# Patient Record
Sex: Female | Born: 1965 | Hispanic: Yes | Smoking: Never smoker
Health system: Southern US, Community
[De-identification: ages and names within clinical notes are randomized; demographics above are authoritative.]

## PROBLEM LIST (undated history)

## (undated) DIAGNOSIS — E119 Type 2 diabetes mellitus without complications: Secondary | ICD-10-CM

## (undated) DIAGNOSIS — M1990 Unspecified osteoarthritis, unspecified site: Secondary | ICD-10-CM

## (undated) DIAGNOSIS — E079 Disorder of thyroid, unspecified: Secondary | ICD-10-CM

## (undated) DIAGNOSIS — K5900 Constipation, unspecified: Secondary | ICD-10-CM

## (undated) HISTORY — DX: Type 2 diabetes mellitus without complications: E11.9

## (undated) HISTORY — DX: Unspecified osteoarthritis, unspecified site: M19.90

## (undated) HISTORY — DX: Disorder of thyroid, unspecified: E07.9

---

## 2002-08-27 ENCOUNTER — Emergency Department (HOSPITAL_COMMUNITY): Admission: EM | Admit: 2002-08-27 | Discharge: 2002-08-27 | Payer: Self-pay | Admitting: Emergency Medicine

## 2005-08-12 ENCOUNTER — Emergency Department (HOSPITAL_COMMUNITY): Admission: EM | Admit: 2005-08-12 | Discharge: 2005-08-12 | Payer: Self-pay | Admitting: Emergency Medicine

## 2013-11-23 ENCOUNTER — Ambulatory Visit (INDEPENDENT_AMBULATORY_CARE_PROVIDER_SITE_OTHER): Payer: BC Managed Care – PPO | Admitting: Internal Medicine

## 2013-11-23 VITALS — BP 130/70 | HR 119 | Temp 101.7°F | Resp 16 | Ht 60.0 in | Wt 188.0 lb

## 2013-11-23 DIAGNOSIS — R197 Diarrhea, unspecified: Secondary | ICD-10-CM

## 2013-11-23 DIAGNOSIS — M791 Myalgia, unspecified site: Secondary | ICD-10-CM

## 2013-11-23 DIAGNOSIS — R509 Fever, unspecified: Secondary | ICD-10-CM

## 2013-11-23 DIAGNOSIS — R110 Nausea: Secondary | ICD-10-CM

## 2013-11-23 LAB — POCT CBC
Granulocyte percent: 81.7 %G — AB (ref 37–80)
HCT, POC: 39 % (ref 37.7–47.9)
Hemoglobin: 11.9 g/dL — AB (ref 12.2–16.2)
Lymph, poc: 1.2 (ref 0.6–3.4)
MCH, POC: 26.4 pg — AB (ref 27–31.2)
MCHC: 30.5 g/dL — AB (ref 31.8–35.4)
MCV: 86.5 fL (ref 80–97)
MID (cbc): 0.5 (ref 0–0.9)
MPV: 8.4 fL (ref 0–99.8)
POC Granulocyte: 7.6 — AB (ref 2–6.9)
POC LYMPH PERCENT: 13.2 %L (ref 10–50)
POC MID %: 5.1 %M (ref 0–12)
Platelet Count, POC: 325 10*3/uL (ref 142–424)
RBC: 4.51 M/uL (ref 4.04–5.48)
RDW, POC: 15.2 %
WBC: 9.3 10*3/uL (ref 4.6–10.2)

## 2013-11-23 LAB — POCT URINALYSIS DIPSTICK
Bilirubin, UA: NEGATIVE
Glucose, UA: NEGATIVE
Leukocytes, UA: NEGATIVE
Nitrite, UA: NEGATIVE
Protein, UA: 30
Spec Grav, UA: 1.02
Urobilinogen, UA: 0.2
pH, UA: 7

## 2013-11-23 LAB — POCT UA - MICROSCOPIC ONLY
Bacteria, U Microscopic: NEGATIVE
Casts, Ur, LPF, POC: NEGATIVE
Crystals, Ur, HPF, POC: NEGATIVE
Yeast, UA: NEGATIVE

## 2013-11-23 MED ORDER — ONDANSETRON 4 MG PO TBDP
4.0000 mg | ORAL_TABLET | Freq: Once | ORAL | Status: AC
  Administered 2013-11-23: 4 mg via ORAL

## 2013-11-23 MED ORDER — DICYCLOMINE HCL 20 MG PO TABS: 20.0000 mg | ORAL_TABLET | Freq: Four times a day (QID) | ORAL | Status: DC

## 2013-11-23 NOTE — Patient Instructions (Signed)
2 Tylenol every 6 hours for 2 days will control fever and pain

## 2013-11-23 NOTE — Progress Notes (Signed)
Subjective:    Patient ID: Courtney Foster, female    DOB: 03/07/1966, 48 y.o.   MRN: [id number]  Chief Complaint  Patient presents with  . Diarrhea    every hour since last night  . Generalized Body Aches  . Fever    Diarrhea  Associated symptoms include abdominal pain and a fever. Pertinent negatives include no coughing or vomiting.  Fever  Associated symptoms include abdominal pain, diarrhea and nausea. Pertinent negatives include no coughing or vomiting.   This chart was scribed for Courtney Lin, MD by Thea Alken, ED Scribe. This patient was seen in room 12 and the patient's care was started at 1:22 PM.  HPI Comments: Courtney Foster is a 48 y.o. female who presents to the [facility] complaining of diarrhea onset 1 day . Pt report that she has been sick for about 2 weeks with a cold but denies having diarrhea 2 weeks ago . Today the pt is complaining of medial abdominal pain, diarrhea with 4 episodes 1 day ago, nausea, body aches and fatigue. Pt denies having an episode of emesis.  She denies cough, dysuria and frequency. Pt denies sick contact She had a similar illness one week ago.  Past Medical History  Diagnosis Date  . Thyroid disease    No Known Allergies Prior to Admission medications   Medication Sig Start Date End Date Taking? Authorizing Provider  levothyroxine (SYNTHROID, LEVOTHROID) 75 MCG tablet Take 75 mcg by mouth daily before breakfast.   Yes Historical Provider, MD   Review of Systems  Constitutional: Positive for fever and fatigue.  Respiratory: Negative for cough.   Gastrointestinal: Positive for nausea, abdominal pain and diarrhea. Negative for vomiting.  Genitourinary: Negative for dysuria and frequency.      Objective:   Physical Exam  Nursing note and vitals reviewed. Constitutional: She is oriented to person, place, and time. She appears well-developed and well-nourished. No distress.  HENT:  Head: Normocephalic and  atraumatic.  Eyes: EOM are normal.  Neck: Neck supple. No tracheal deviation present.  Cardiovascular: Normal rate.   Pulmonary/Chest: Effort normal. No respiratory distress.  Musculoskeletal: Normal range of motion.  Neurological: She is alert and oriented to person, place, and time.  Skin: Skin is warm and dry.  Psychiatric: She has a normal mood and affect. Her behavior is normal.   Filed Vitals:   11/23/13 1224  BP: 130/70  Pulse: 119  Temp: 101.7 F (38.7 C)  TempSrc: Oral  Resp: 16  Height: 5' (1.524 m)  Weight: 188 lb (85.276 kg)  SpO2: 99%   Results for orders placed in visit on 11/23/13  POCT CBC      Result Value Ref Range   WBC 9.3  4.6 - 10.2 K/uL   Lymph, poc 1.2  0.6 - 3.4   POC LYMPH PERCENT 13.2  10 - 50 %L   MID (cbc) 0.5  0 - 0.9   POC MID % 5.1  0 - 12 %M   POC Granulocyte 7.6 (*) 2 - 6.9   Granulocyte percent 81.7 (*) 37 - 80 %G   RBC 4.51  4.04 - 5.48 M/uL   Hemoglobin 11.9 (*) 12.2 - 16.2 g/dL   HCT, POC 39.0  37.7 - 47.9 %   MCV 86.5  80 - 97 fL   MCH, POC 26.4 (*) 27 - 31.2 pg   MCHC 30.5 (*) 31.8 - 35.4 g/dL   RDW, POC 15.2     Platelet  Count, POC 325  142 - 424 K/uL   MPV 8.4  0 - 99.8 fL  POCT URINALYSIS DIPSTICK      Result Value Ref Range   Color, UA yellow     Clarity, UA clear     Glucose, UA neg     Bilirubin, UA neg     Ketones, UA eng     Spec Grav, UA 1.020     Blood, UA trace     pH, UA 7.0     Protein, UA 30     Urobilinogen, UA 0.2     Nitrite, UA neg     Leukocytes, UA Negative    POCT UA - MICROSCOPIC ONLY      Result Value Ref Range   WBC, Ur, HPF, POC 0-4     RBC, urine, microscopic 0-2     Bacteria, U Microscopic neg     Mucus, UA large     Epithelial cells, urine per micros 2-6     Crystals, Ur, HPF, POC neg     Casts, Ur, LPF, POC neg     Yeast, UA neg         Assessment & Plan:  I have completed the patient encounter in its entirety as documented by the scribe, with editing by me where  necessary. Robert P. Laney Pastor, M.D. Diarrhea - Fever - Nausea - Myalgia -due to fever  Meds ordered this encounter  Medications  . ondansetron (ZOFRAN-ODT) disintegrating tablet 4 mg    Sig:   . dicyclomine (BENTYL) 20 MG tablet    Sig: Take 1 tablet (20 mg total) by mouth every 6 (six) hours.    Dispense:  12 tablet    Refill:  0   OOW 2 d Stool cult

## 2013-11-25 ENCOUNTER — Telehealth: Payer: Self-pay

## 2013-11-25 DIAGNOSIS — R197 Diarrhea, unspecified: Secondary | ICD-10-CM

## 2013-11-25 NOTE — Telephone Encounter (Signed)
Dr. Laney Pastor, pt already had note that you signed earlier today, but isn't feeling much better and now needs one for today and tomorrow, return on Thurs. Is this ok?

## 2013-11-26 MED ORDER — CIPROFLOXACIN HCL 500 MG PO TABS: 500.0000 mg | ORAL_TABLET | Freq: Two times a day (BID) | ORAL | Status: DC

## 2013-11-26 NOTE — Telephone Encounter (Signed)
Spoke with pt. She states she cannot return to work tomorrow because she is still having diarrhea and her body aches. She says she has to work Thursday and Saturday and would like to be out of work until Monday. Do you want her to RTC since she is still not feeling well?

## 2013-11-26 NOTE — Telephone Encounter (Signed)
Sent med to [employer]. LMOM for pt to CB.

## 2013-11-26 NOTE — Telephone Encounter (Signed)
Cult still pending We should start empiric treatment and ok out of work Call in cipro 500 bid for 7 days If not well recheck mondat at 4

## 2013-11-26 NOTE — Telephone Encounter (Signed)
Pt Cb and I explained new Abx and f/up on Monday if not better. Wrote OOW note for pt and she will p/up.

## 2013-11-26 NOTE — Telephone Encounter (Signed)
[place] for [employer] note

## 2013-11-28 ENCOUNTER — Telehealth: Payer: Self-pay | Admitting: Physician Assistant

## 2013-11-28 NOTE — Telephone Encounter (Signed)
Error

## 2013-12-01 ENCOUNTER — Ambulatory Visit (INDEPENDENT_AMBULATORY_CARE_PROVIDER_SITE_OTHER): Payer: BC Managed Care – PPO | Admitting: Internal Medicine

## 2013-12-01 VITALS — BP 110/70 | HR 68 | Temp 98.4°F | Resp 18 | Ht 60.5 in | Wt 190.0 lb

## 2013-12-01 DIAGNOSIS — A049 Bacterial intestinal infection, unspecified: Secondary | ICD-10-CM

## 2013-12-01 MED ORDER — DICYCLOMINE HCL 20 MG PO TABS: 20.0000 mg | ORAL_TABLET | Freq: Four times a day (QID) | ORAL | Status: DC

## 2013-12-01 NOTE — Progress Notes (Signed)
Subjective:     Patient ID: Courtney Foster, female   DOB: Sep 21, 1965, 48 y.o.   MRN: [id number]  HPI 48 yr old female complains of continued diarrhea which started on 11/22/13.   Was seen in office on 11/23/13 complaining of fever and body aches along with diarrhea. Started cipro on 11/26/13. Pt iIs no longer having fever and body aches but continues to have diarrhea and fatigue.  Loose stools still present if she eats soft foods.  Around 3 episodes of loose stools per day.  Denies blood and very foul smelling stools but states there is still mucus. Complaining of left upper quadrant pain.  Denies nausea and vomiting.  Able to eat and is stating she is hungry.  Overall pt seems to be improving but is curious about her stool culture results.  Stool culture still pending.   Review of Systems  Constitutional: Positive for fatigue. Negative for fever, activity change and appetite change.  HENT: Negative for congestion.   Respiratory: Negative for shortness of breath.   Cardiovascular: Positive for chest pain. Negative for leg swelling.  Gastrointestinal: Positive for nausea, abdominal pain, diarrhea and abdominal distention. Negative for vomiting, constipation and blood in stool.  Genitourinary: Negative for frequency and difficulty urinating.  Musculoskeletal: Negative for arthralgias.  Skin: Negative for rash.  Neurological: Negative for dizziness, light-headedness and headaches.       Objective:   Physical Exam GEN: WDWN, NAD, Non-toxic, A & O x 3 HEENT: Atraumatic, Normocephalic. Neck supple. No masses, No LAD. Left eye w visible cataract  Ears and Nose: No external deformity. CV: RRR, No M/G/R. No JVD. No thrill. No extra heart sounds. PULM: CTA B, no wheezes, crackles, rhonchi. No retractions. No resp. distress. No accessory muscle use. ABD:  + BS, obese, soft, no rebound, no hsm, non tender EXTR: No c/c/e NEURO Normal gait.  CN II-XII intact  PSYCH: Normally interactive. Conversant. Not  depressed or anxious appearing.  Calm demeanor.       Assessment:    1. Likely bacterial gastrointestinal infection    Plan:      1. Finish course of ciprofloxacin 2. awaiting stool culture results 3. Ok to go back to work  4. Refill Bentyl   I have completed the patient encounter in its entirety as documented by [place], with editing by me where necessary. Nikitha Mode P. Laney Pastor, M.D.

## 2013-12-16 ENCOUNTER — Encounter: Payer: Self-pay | Admitting: Internal Medicine

## 2013-12-16 ENCOUNTER — Telehealth: Payer: Self-pay | Admitting: Internal Medicine

## 2013-12-16 NOTE — Telephone Encounter (Signed)
Patient contact number is [phone]

## 2013-12-16 NOTE — Telephone Encounter (Signed)
All done --Threasa Beards finished and gave to med records--Jasmine

## 2013-12-16 NOTE — Telephone Encounter (Signed)
Patient dropped off disability paperwork on 12/12/2013. Stated that she needed this completed before 12/17/2013. Explained to patient that the time frame to complete such paperwork is 5-7 business days and that her forms are in Dr. Ninfa Meeker box awaiting completion. Patient called again and stated that she spoke with her job and they have requested that she a treatment letter faxed to them stating what she was treated for etc. Patient says that this is very urgent has to be done today. Please fax signed letter [phone]  Please call patient for more information and to confirm fax number (she read it to me 3x but it was difficult to understand.)

## 2014-01-07 LAB — STOOL CULTURE

## 2014-03-25 DIAGNOSIS — Z0271 Encounter for disability determination: Secondary | ICD-10-CM

## 2014-05-08 ENCOUNTER — Ambulatory Visit (INDEPENDENT_AMBULATORY_CARE_PROVIDER_SITE_OTHER): Payer: BC Managed Care – PPO | Admitting: Internal Medicine

## 2014-05-08 VITALS — BP 132/72 | HR 70 | Temp 97.5°F | Resp 16 | Ht 61.0 in | Wt 196.0 lb

## 2014-05-08 DIAGNOSIS — N925 Other specified irregular menstruation: Secondary | ICD-10-CM

## 2014-05-08 DIAGNOSIS — N938 Other specified abnormal uterine and vaginal bleeding: Secondary | ICD-10-CM

## 2014-05-08 DIAGNOSIS — N949 Unspecified condition associated with female genital organs and menstrual cycle: Secondary | ICD-10-CM

## 2014-05-08 DIAGNOSIS — N926 Irregular menstruation, unspecified: Secondary | ICD-10-CM

## 2014-05-08 LAB — POCT URINE PREGNANCY: Preg Test, Ur: NEGATIVE

## 2014-05-09 NOTE — Progress Notes (Signed)
She is here out of concern that her last period was in May 2015 She has had no weight gain, nausea, breast tenderness but considers pregnancy possible. Her 2 children are 9 and 11. Of note she has hypothyroidism which has not been well controlled recently with 2 dose escalations in the last 6 months.  Review of GI and GU negative  Exam BP 132/72  Pulse 70  Temp(Src) 97.5 F (36.4 C) (Oral)  Resp 16  Ht 5\' 1"  (1.549 m)  Wt 196 lb (88.905 kg)  BMI 37.05 kg/m2  SpO2 100%  LMP 11/19/2013 No distress Alert and oriented  Results for orders placed in visit on 05/08/14  POCT URINE PREGNANCY      Result Value Ref Range   Preg Test, Ur Negative     Impression Secondary amenorrhea due to hypothyroidism rather than pregnancy  She will follow with her primary care next week as planned

## 2014-08-31 ENCOUNTER — Ambulatory Visit (INDEPENDENT_AMBULATORY_CARE_PROVIDER_SITE_OTHER): Payer: BLUE CROSS/BLUE SHIELD | Admitting: Gynecology

## 2014-08-31 ENCOUNTER — Encounter: Payer: Self-pay | Admitting: Gynecology

## 2014-08-31 VITALS — BP 130/86 | Ht 60.0 in | Wt 200.0 lb

## 2014-08-31 DIAGNOSIS — N911 Secondary amenorrhea: Secondary | ICD-10-CM

## 2014-08-31 DIAGNOSIS — E669 Obesity, unspecified: Secondary | ICD-10-CM

## 2014-08-31 DIAGNOSIS — E039 Hypothyroidism, unspecified: Secondary | ICD-10-CM

## 2014-08-31 DIAGNOSIS — N951 Menopausal and female climacteric states: Secondary | ICD-10-CM

## 2014-08-31 LAB — PREGNANCY, URINE: Preg Test, Ur: NEGATIVE

## 2014-08-31 NOTE — Progress Notes (Signed)
   Patient is a 49 year old gravida 4 para 2 Ab2 (2 cesarean section/2 spontaneous AB) who was referred to our practice as a courtesy of her endocrinologist Dr. Diamantina Providence in reference to patient's secondary amenorrhea. Patient was able to bring some notes from her last office with him dated 07/31/2014 whereby he had adjusted her thyroid medication. According to his notes patient has had history of Graves' disease/R immune thyroid disease who had been off of her thyroid medication for proximally one month and develop subsequently hypothyroidism for which she believed was a variant of Hashimoto's thyroiditis. He had checked her TSH which we do not have the result but patient stated that she is currently on levothyroid 100 g daily and on Saturday she takes a tablet and a half. He was in the process of ordering an [facility] which we do not have the result. Patient has not been using any form of contraception. Patient denies any unusual headaches or visual disturbances or any galactorrhea. Her urine pregnancy test in our office today was negative. She states that her PCP in [facility] has been doing her exams and Pap smear and she is not due until the end of this year.  Exam: Overweight female with a weight of 200 pounds BMI 39.06 blood pressure 130/86 Abdomen: Pendulous soft nontender no rebound or guarding Pelvic: Bartholin urethra Skene glands within normal limits Vagina: No lesions or discharge Uterus: Somewhat limited as a result of her abdominal girth nontender Adnexa same as above Rectal exam: Not done  Assessment for/plan: Secondary amenorrhea on this 49 year old with no vasomotor symptoms or any breast tenderness, nausea, vomiting, visual disturbances or any unusual headache or nipple discharge. We are going to check a prolactin level today, along with her [facility] and attempted to get the results of her recent thyroid function tests make sure that she is euthyroid. Patient could  potentially be perimenopausal. Literature information on perimenopause/menopause/hormone replacement therapy was provided in Spanish. She will return back to the office for an ultrasound for better assessment of her female reproductive organs and discussed the above results.

## 2014-08-31 NOTE — Patient Instructions (Addendum)
Perimenopausia (Perimenopause) La perimenopausia es el momento en que su cuerpo comienza a pasar a la menopausia (sin menstruacin durante 12 meses consecutivos). Es un proceso natural. La perimenopausia puede comenzar entre 2 y 33 aos antes de la menopausia y por lo general tiene una duracin de 1 ao ms pasada la menopausia. [employer], los ovarios podran producir un vulo o no. Los ovarios varan su produccin de las hormonas estrgeno y Technical brewer. Esto puede causar perodos menstruales irregulares, dificultad para quedar embarazada, hemorragia vaginal entre perodos y sntomas incmodos. CAUSAS  Produccin irregular de las hormonas ovricas estrgeno y Immunologist, y no ovular todos los meses.  Otras causas son:  Tumor de la glndula pituitaria.  Enfermedades que [employer] ovarios.  Radioterapia.  Quimioterapia.  Causas desconocidas.  Fumar mucho y abusar del consumo de alcohol puede llevar a que la perimenopausia aparezca antes. SIGNOS Y SNTOMAS   Acaloramiento.  Sudoracin nocturna.  Perodos menstruales irregulares.  Disminucin del deseo sexual.  Sequedad vaginal.  Dolores de cabeza.  Cambios en el estado de nimo.  Depresin.  Problemas de memoria.  Irritabilidad.  Cansancio.  Aumento de [place].  Problemas para quedar embarazada.  Prdida de clulas seas (osteoporosis).  Comienzo de endurecimiento de las arterias (aterosclerosis). DIAGNSTICO  El mdico realizar un diagnstico en funcin de su edad, historial de perodos menstruales y sntomas. Le realizarn un examen fsico para ver si hay algn cambio en su cuerpo, en especial en sus rganos reproductores. Las pruebas hormonales pueden ser o no tiles segn la cantidad de hormonas femeninas que produzca y [employer] produzca. Sin embargo, podrn Microbiologist pruebas hormonales para Statistician. TRATAMIENTO  En algunos casos, no se necesita tratamiento. La  decisin acerca de qu tratamiento es necesario durante la perimenopausia deber realizarse en conjunto con su mdico segn cmo estn afectando los sntomas a su estilo de vida. Existen varios tratamientos disponibles, como:  Risk manager cada sntoma individual con medicamentos especficos para ese sntoma.  Algunos medicamentos herbales pueden ayudar en sntomas especficos.  Psicoterapia.  Terapia grupal. INSTRUCCIONES PARA EL CUIDADO EN EL HOGAR   Controle sus periodos menstruales (cundo ocurren, qu tan abundantes son, cunto tiempo pasa entre perodos, y cunto duran) como tambin sus sntomas y cundo comenzaron.  Tome slo medicamentos de venta libre o recetados, segn las indicaciones del mdico.  Duerma y descanse.  Haga actividad fsica.  Consuma una dieta que contenga calcio (bueno para los [place]) y productos derivados de la soja (actan como estrgenos).  No fume.  Evite las bebidas alcohlicas.  Tome los suplementos vitamnicos segn las indicaciones del mdico. En ciertos casos, puede ser de [place] tomar vitamina E.  Tome suplementos de calcio y vitamina D para ayudar a Publishing rights manager prdida sea.  En algunos casos la terapia de grupo podr ayudarla.  La acupuntura puede ser de ayuda en ciertos casos. SOLICITE ATENCIN MDICA SI:   Tiene preguntas acerca de sus sntomas.  Necesita ser derivada a un especialista (gineclogo, psiquiatra, o psiclogo). SOLICITE ATENCIN MDICA DE INMEDIATO SI:   Sufre una hemorragia vaginal abundante.  Su perodo menstrual dura ms de 8 das.  Sus perodos son recurrentes cada menos de [address]  Tiene hemorragias durante las [employer].  Est muy deprimido.  Siente dolor al [employer].  Siente dolor de cabeza intenso.  Tiene problemas de visin. Document Released: 08/07/2005 Document Revised: 05/28/2013 [facility] Patient Information 2015 [place], [place]. This information is not intended to replace advice given to you  by your health care provider. Make sure you discuss any questions you have with your health care provider. Hipotiroidismo (Hypothyroidism) La tiroides es una glndula grande ubicada en la parte anterior e inferior del cuello. La glndula tiroides interviene en el control del metabolismo. El metabolismo es el modo en que el organismo utiliza los alimentos. El control del metabolismo se realiza a travs de una hormona denominada tiroxina. Cuando la actividad de esta glndula est por debajo de lo normal (hipotiroidismo) produce muy poca cantidad de hormona. CAUSAS Aqu se incluyen:  Ausencia de tejido tiroideo. Bocio por dficit de yodo. Bocio por medicamentos. Defectos congnitos (desde el nacimiento). Trastornos de la glndula pituitaria Esto ocasiona la falta de TSH (sigla que significa hormona estimulante de la tiroides) Esta hormona le informa a la tiroides que debe producir ms hormona. SNTOMAS Letargia (sentir que no se tiene Teacher, early years/pre) Intolerancia al fro [employer] (a pesar de una ingesta normal de alimentos) Piel seca [place] Irregularidades menstruales Enlentecimiento de los procesos de pensamiento La insuficiente cantidad de hormona tiroidea tambin puede ocasionar problemas cardacos. El hipotiroidismo en el recin nacido es el cretinismo en su forma extrema. Es importante que esta forma se trate de modo adecuado e inmediato, ya que puede conducir rpidamente al retardo del desarrollo fsico y mental. DIAGNSTICO Para comprobar la existencia de hipotiroidismo, Mining engineer anlisis de sangre y radiografas y estudios con [employer]. Muchas veces los signos estn ocultos. Es necesario que el profesional vigile la enfermedad con anlisis de [place]. Esto se realiza luego de Electrical engineer diagnstico (determinar cul es el problema). Puede ser necesario que el profesional que lo asiste controle esta enfermedad con anlisis de sangre ya sea antes o despus del  diagnstico y [place]. TRATAMIENTO Los niveles bajos de hormona tiroidea se incrementan con el uso de hormona tiroidea sinttica. Este es un tratamiento seguro y [place]. Se dispone de hormona tiroidea sinttica para el tratamiento de este trastorno. Generalmente lleva algunas semanas obtener el efecto total de los medicamentos. Luego de obtener el efecto completo del [place], habitualmente pasan otras cuatro semanas para que los sntomas empiezan a Armed forces operational officer. El profesional podr comenzar indicndole dosis bajas. Si usted tuvo problemas cardacos, la dosis se aumentar de manera gradual. Podr volver a lo normal sin Firefighter una situacin de emergencia. [place] los [employer] ha indicado el profesional que lo asiste. Infrmele al profesional todos los medicamentos que toma o que ha comenzado a Radio producer. El profesional que lo asiste lo ayudar con los esquemas de las dosis. A medida que obtiene mejora, es necesario aumentar la dosis. Ser necesario Optometrist continuos anlisis de [place], segn lo indique el profesional. Informe acerca de todos los efectos secundarios que sospeche que podran deberse a los medicamentos. SOLICITE ATENCIN MDICA SI: Solicite atencin mdica si observa: Sudoracin. Temblores. Ansiedad. Rpida prdida de peso. Intolerancia al calor. Cambios emocionales. Diarrea. Debilidad. SOLICITE ATENCIN MDICA DE INMEDIATO SI: Presenta dolor en el pecho, una frecuencia cardaca irregular (palpitaciones) o latidos cardacos rpidos. EST SEGURO QUE:  Comprende las instrucciones para el alta mdica. Controlar su enfermedad. Solicitar atencin mdica de inmediato segn las indicaciones. Document Released: 08/07/2005 Document Revised: 10/30/2011 [facility] Patient Information 2015 [place]. This information is not intended to replace advice given to you by your health care provider. Make sure you discuss any  questions you have with your health care provider. Terapia de reemplazo hormonal (Hormone Replacement Therapy) En la menopausia, su cuerpo comienza a  producir menos estrgeno y Immunologist. Esto provoca que el cuerpo deje de tener perodos [place]. Esto se debe a que el estrgeno y la progesterona controlan sus perodos y su ciclo menstrual. Ardelia Mems falta de estrgeno puede causar sntomas tales como: Social research officer, government. Sequedad vaginal Piel seca. Prdida del deseo sexual. Riesgo de prdida de hueso (osteoporosis). Cuando esto ocurre, puede elegir realizar Ardelia Mems terapia hormonal para volver a Clinical research associate estrgeno perdido [employer]. Cuando slo se introduce esta hormona, el procedimiento se conoce normalmente como TRE (terapia de reemplazo de [place]). Cuando la hormona progestina se combina con el estrgeno, el procedimiento se conoce normalmente como TH (terapia hormonal). Esto es lo que previamente se conoca como terapia de reemplazo hormonal (TRH). El profesional que le asiste le ayudar a tomar una decisin acerca de lo que resulte lo mejor para usted. La decisin de realizar una TRH cambia a menudo debido a que se Risk manager. Muchos estudios no ponen de acuerdo con respecto a los beneficios de Optometrist una terapia de reemplazo hormonal.  BENEFICIOS PROBABLES DE LA TRH QUE INCLUYEN PROTECCIN CONTRA: Golpes de calor - Un golpe de calor es la sensacin repentina de calor sobre la cara y el cuerpo. La piel enrojece, como al sonrojarse. Estn asociados con la transpiracin y los trastornos del sueo. Las mujeres que atraviesan la menopausia pueden tener golpes de calor unas pocas veces en el mes o varios al da; esto depende de la mujer. Osteoporosis (prdida de hueso) - El estrgeno ayuda a protegerse contra la prdida de [place]. Luego de la [place], los huesos de una mujer pierden calcio y se vuelven frgiles y Publishing rights manager. Como resultado, es ms probable que el hueso se  [place]. Los que resultan afectados con mayor frecuencia son los de la cadera, la [place] y la columna vertebral. La terapia hormonal puede ayudar a retardar la prdida de hueso luego de la menopausia. Realizar ejercicios con peso y tomar calcio con vitamina D tambin puede ayudar a prevenir la prdida de [place]. Existen medicamentos que puede prescribir el profesional que la asiste para ayudar a prevenir la osteoporosis. Sequedad vaginal - La prdida de estrgeno produce cambios en la vagina. El recubrimiento de la misma puede volverse fino y Education officer, museum. Estos cambios pueden causar dolor y [place]. La sequedad tambin puede producir una infeccin. Puede ocasionarle ardor y [place]. Las infecciones en las vas urinarias son ms comunes luego de la menopausia debido a la falta de Oceanographer. Otros beneficios posibles del estrgeno incluyen un cambio positivo en el humor y en la memoria de corto plazo en las mujeres. EFECTOS SECUNDARIOS Y RIESGOS Utilizar estrgeno slo sin progesterona causa que el recubrimiento del tero crezca. Esto aumenta el riesgo de cncer endometrial. El profesional que la asiste deber darle otra hormona llamada progestina, si usted tiene tero. Las mujeres que realizan una TH combinada (estrgeno y progestina) parecen tener un mayor riesgo de sufrir cncer de mama. El riesgo parece ser [place], [place] aumenta a lo largo del tiempo que se realice la [place]. La terapia combinada tambin hace que el tejido mamario sea levemente ms denso, lo que hace que sea ms difcil leer mamografas (radiografas de mama). Combinada, la terapia de estrgeno y Immunologist puede realizarse todos los das, en cuyo caso podrn Warehouse manager de [place]. La TH puede realizarse de [place] cclica, en cuyo caso tendr perodos menstruales. La TH puede aumentar el riesgo de [place], ataque cardaco, cncer de mama y formacin de cogulos en la  pierna. TRATAMIENTO Si decide realizar  Ardelia Mems TH y tiene tero, normalmente se prescribe el uso de estrgeno y progestina. El profesional que la asiste le ayudar a decidir la mejor forma de Mattel. Lo mejor es Chief Executive Officer dosis posible que pueda ayudarla con sus sntomas y tomarlos durante la menor cantidad de tiempo posible. La terapia hormonal puede ayudar a Banker de los problemas (sntomas) que afectan a las mujeres durante la menopausia. Antes de tomar una decisin con respecto a la TH, converse con el profesional que la asiste acerca de qu es lo mejor para usted. Mantngase bien informada y sintase cmoda con sus decisiones. INSTRUCCIONES PARA EL CUIDADO DOMICILIARIO: [place] indicaciones del profesional con respecto a cmo Biomedical scientist. Hgase controles de [place] regular, e incluya [place] y [place]. SOLICITE ATENCIN MDICA DE INMEDIATO SI PRESENTA: Hemorragia vaginal anormal. Dolor o inflamacin en las piernas, falta de aliento o Tourist information centre manager. Mareos o dolores de [place]. Protuberancias o cambios en sus mamas o axilas. Pronunciacin inarticulada. Debilidad o adormecimiento en los brazos o las piernas. Dolor, ardor o sangrado al [employer]. Dolor abdominal. Document Released: 01/24/2008 Document Revised: 10/30/2011 [facility] Patient Information 2015 [place], [place]. This information is not intended to replace advice given to you by your health care provider. Make sure you discuss any questions you have with your health care provider.

## 2014-09-01 LAB — FOLLICLE STIMULATING HORMONE: FSH: 3.3 m[IU]/mL

## 2014-09-01 LAB — PROLACTIN: Prolactin: 24 ng/mL

## 2014-09-02 ENCOUNTER — Ambulatory Visit (INDEPENDENT_AMBULATORY_CARE_PROVIDER_SITE_OTHER): Payer: BLUE CROSS/BLUE SHIELD | Admitting: Gynecology

## 2014-09-02 ENCOUNTER — Other Ambulatory Visit: Payer: Self-pay | Admitting: Gynecology

## 2014-09-02 ENCOUNTER — Encounter: Payer: Self-pay | Admitting: Gynecology

## 2014-09-02 ENCOUNTER — Telehealth: Payer: Self-pay | Admitting: *Deleted

## 2014-09-02 ENCOUNTER — Other Ambulatory Visit: Payer: Self-pay | Admitting: Internal Medicine

## 2014-09-02 ENCOUNTER — Ambulatory Visit (INDEPENDENT_AMBULATORY_CARE_PROVIDER_SITE_OTHER): Payer: BLUE CROSS/BLUE SHIELD

## 2014-09-02 VITALS — BP 132/84

## 2014-09-02 DIAGNOSIS — R1900 Intra-abdominal and pelvic swelling, mass and lump, unspecified site: Secondary | ICD-10-CM

## 2014-09-02 DIAGNOSIS — N951 Menopausal and female climacteric states: Secondary | ICD-10-CM

## 2014-09-02 DIAGNOSIS — N9489 Other specified conditions associated with female genital organs and menstrual cycle: Secondary | ICD-10-CM | POA: Insufficient documentation

## 2014-09-02 DIAGNOSIS — R9389 Abnormal findings on diagnostic imaging of other specified body structures: Secondary | ICD-10-CM

## 2014-09-02 DIAGNOSIS — N838 Other noninflammatory disorders of ovary, fallopian tube and broad ligament: Secondary | ICD-10-CM

## 2014-09-02 DIAGNOSIS — E669 Obesity, unspecified: Secondary | ICD-10-CM

## 2014-09-02 DIAGNOSIS — N911 Secondary amenorrhea: Secondary | ICD-10-CM

## 2014-09-02 DIAGNOSIS — N949 Unspecified condition associated with female genital organs and menstrual cycle: Secondary | ICD-10-CM

## 2014-09-02 DIAGNOSIS — N912 Amenorrhea, unspecified: Secondary | ICD-10-CM

## 2014-09-02 DIAGNOSIS — R938 Abnormal findings on diagnostic imaging of other specified body structures: Secondary | ICD-10-CM

## 2014-09-02 DIAGNOSIS — N839 Noninflammatory disorder of ovary, fallopian tube and broad ligament, unspecified: Secondary | ICD-10-CM

## 2014-09-02 MED ORDER — MEDROXYPROGESTERONE ACETATE 10 MG PO TABS: 10.0000 mg | ORAL_TABLET | Freq: Every day | ORAL | Status: DC

## 2014-09-02 NOTE — Progress Notes (Signed)
   Patient presented to the office today to discuss the ultrasound done here in our office. This is being done as part of her evaluation for secondary menorrhea/obesity/perimenopause with history of hypothyroidism. Please see previous note dated January 11 for additional detail. Her [place] and prolactin were normal yesterday here in the office and her pregnancy test was negative. 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 This resulted in 2 successful pregnancies resulting in cesarean sections. Patient denies any nipple discharge or any unusual headaches or blurry vision.  Ultrasound today: Uterus measured 10.9 x 7.6 x 5.7 cm with endometrial stripe of 11.7 mm. (Last reported menstrual period 11/29/2013). Right ovary had 2 simple cysts echo-free measuring 19 x 18 mm, 16 x 20 mm. 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 There was no fluid in the cul-de-sac.  Patient has stated she has had left lower quadrant discomfort for quite some time. She did have an upper abdominal ultrasound ordered by her PCP last year and was diagnosis of fatty liver.  Assessment/plan: Secondary amenorrhea probably attributed to her obesity contributing to anovulation. Risk of endometrial hyperplasia and endometrial cancer was discussed with the patient. Since her urine patency test was negative she will be prescribed Provera 10 mg to take 1 by mouth daily for 10 days to withdrawal. I've given a refill for her to take every 30 days if she does not have a spontaneous menses after having performed a home urine pregnancy test confirming that it is negative. She is  currently using condoms for contraception. We are going to order a CA 125 today and schedule an MRI of the pelvis in the next few weeks to help delineate the mass from a hydrosalpinx versus an ovarian mass. We'll then set down and discuss the results and plan a course of management which will probably be a laparoscopic bilateral salpingectomy or left salpingo-oophorectomy with right salpingectomy. Literature information on all the above was provided in [place].

## 2014-09-02 NOTE — Telephone Encounter (Signed)
Scheduled on 09/16/14 @ 11:45am check in, at [facility] will need to be NPO (nothing by mouth 4 hours prior to test) [phone]. Pt will need to go to radiology department. Rosemarie Ax will inform patient.

## 2014-09-02 NOTE — Patient Instructions (Signed)
Health visitor Doctor, general practice Imaging) La Health visitor (RM) es un estudio de diagnstico por imgenes que produce imgenes digitales ntidas del interior del organismo sin usar rayosX. El resonador magntico [place] de radio y Engineer, manufacturing magntico para crear las imgenes. Las imgenes de Health visitor pueden Photographer en comparacin con las imgenes que se obtienen a Proofreader de las radiografas, las tomografas computarizadas o las ecografas. Se puede inyectar una sustancia de contraste para que las imgenes de la resonancia magntica sean incluso ms ntidas. En un resonador P[employer] Inc, la zona del cuerpo en estudio estar dentro de la abertura central del aparato. En los resonadores abiertos, el aparato no rodea el cuerpo por completo.  INFORME A SU MDICO:  Si tiene cirugas previas.  Cualquier pieza de metal que tenga en el organismo. El imn utilizado en la resonancia magntica puede mover los objetos metlicos que hay en el cuerpo. Esto incluye:  Un marcapasos o cualquier otro implante de metal, como un neuroestimulador implantado, un implante metlico del odo o un objeto metlico dentro de la rbita del ojo.  Esquirlas metlicas en el cuerpo.  Cualquier fragmento de bala.  Un puerto para la administracin de insulina o quimioterapia.  Cualquier tatuaje. Algunas sustancias de contraste de color rojo contienen hierro, lo que a veces es un problema.  Si est embarazada o piensa que podra estarlo.  Si est amamantando.  Si le teme a los espacios cerrados (tiene claustrofobia). Si la claustrofobia es un problema, a menudo puede aliviarse con medicamentos o con el uso de un resonador magntico abierto.  Cualquier alergia que tenga.  Todos los L[employer]ell [employer], incluidos vitaminas, hierbas, gotas oftlmicas, cremas y medicamentos de venta libre. RIESGOS Y COMPLICACIONES  En general, la resonancia magntica es un  procedimiento seguro. Sin embargo, pueden ocurrir algunos problemas que incluyen:  Si hay un implante de metal y no es Personal assistant, el potente campo magntico puede afectarlo. Adems, si el implante est cerca del lugar que se estudiar, puede ser difcil obtener imgenes de alta calidad.  Si est embarazada:  Generalmente, no debe realizarse Clinical biochemist trimestre de Media planner. No se conocen los efectos que la resonancia magntica puede tener en el feto. Durante este tiempo, es Multimedia programmer, a menos que se sospeche la presencia de una enfermedad grave que se estudia mejor con una resonancia magntica. Se debe considerar la posibilidad de Film/video editor resonancia magntica si existe un riesgo importante de que se omita el diagnstico correcto si no se realiza [employer].  Si est amamantando:  Debe informar al mdico y preguntarle cmo debe proceder. Puede extraerse SLM Corporation con un sacaleche antes del estudio para [place], [place] tanto se haya eliminado la sustancia de [place], si se la ha [place], del [place]. ANTES DEL PROCEDIMIENTO   Se le pedir que se quite todos los objetos de metal, incluidos:  El reloj, las [place] y otros objetos de metal.  Algunos maquillajes tambin contienen restos de metal y tal vez tenga que quitrselos.  Los aparatos de ortodoncia y las emplomaduras no son un problema. PROCEDIMIENTO  Pueden darle auriculares o audfonos para que escuche msica. La resonancia magntica puede ser ruidosa.  Tal vez le inyecten una sustancia de [place].  La resonancia magntica estndar se realiza en una gran cmara magntica. Usted estar acostado en una plataforma que se desliza dentro de una cmara magntica. Una vez en el interior, podr seguir hablando con la persona que Personnel officer.  El resonador abierto tiene abierto por lo menos uno de los lados.  Le pedirn que se quede muy quieto. Le dirn cundo puede  cambiar de posicin. Tal vez deba esperar unos minutos hasta tanto se compruebe que las imgenes pueden leerse. DESPUS DEL PROCEDIMIENTO   Podr retomar sus actividades habituales de inmediato.  Si le administraron una sustancia de [place], el organismo la eliminar naturalmente en el trmino de Optician, dispensing.  Ardelia Mems persona con experiencia en Environmental manager (radilogo) [employer] y Audiological scientist un informe a su mdico junto con una explicacin de [place]. Document Released: 05/17/2005 Document Revised: 12/22/2013 [facility] Patient Information 2015 [place]. This information is not intended to replace advice given to you by your health care provider. Make sure you discuss any questions you have with your health care provider. Marcador tumoral CA-125 (CA-125 Tumor Marker) El CA-125 es un marcador tumoral que sirve para Aeronautical engineer evolucin del cncer de ovario o de endometrio. PREPARACIN PARA EL ESTUDIO No se requiere Scientist, research (life sciences). HALLAZGOS NORMALES Adultos: 0a35unidades/ml (0a88mliunidades/l) Los rangos para los resultados normales pueden variar entre diferentes laboratorios y hospitales. Consulte siempre con su mdico despus de hPsychologist, counsellingestudio para cArmed forces logistics/support/administrative officersignificado de los r[place]ble [place] si los valores se consideran "dentro de los lmites normales". SIGNIFICADO DEL ESTUDIO  El mdico leer los resultados y hElectrical engineercon usted sobre la importancia y el significado de los r[place] as como las opciones de tratamiento y la necesidad de rOptometristpruebas adicionales, si fuera necesario. OBTENCIN DE LOS RESULTADOS DE LAS PRUEBAS Es su responsabilidad retirar el resultado del ePalmer Consulte en el laboratorio cundo y cmo podr oThe TJX Companies Document Released: 05/28/2013 [facility] [place] This information is not intended to replace advice given to you by your health care provider. Make sure you  discuss any questions you have with your health care provider.

## 2014-09-02 NOTE — Telephone Encounter (Signed)
Patient informed with the below note.  

## 2014-09-02 NOTE — Telephone Encounter (Signed)
-----   Message from Sandi Mariscal, [place] sent at 09/02/2014  9:47 AM EST -----   ----- Message -----    From: Terrance Mass, MD    Sent: 09/02/2014   9:43 AM      To: Sandi Mariscal, RMA  Juliann Pulse please schedule MRI of pelvis with and without contrast for this patient with a left adnexal mass. Then make an appointment for her to see me one week after her MRI

## 2014-09-03 LAB — CA 125: CA 125: 13 U/mL (ref <35)

## 2014-09-16 ENCOUNTER — Ambulatory Visit (HOSPITAL_COMMUNITY)
Admission: RE | Admit: 2014-09-16 | Discharge: 2014-09-16 | Disposition: A | Discharge status: Home / Self Care | Payer: BLUE CROSS/BLUE SHIELD | Source: Ambulatory Visit | Attending: Gynecology | Admitting: Gynecology

## 2014-09-16 DIAGNOSIS — N7011 Chronic salpingitis: Secondary | ICD-10-CM | POA: Insufficient documentation

## 2014-09-16 DIAGNOSIS — N888 Other specified noninflammatory disorders of cervix uteri: Secondary | ICD-10-CM | POA: Insufficient documentation

## 2014-09-16 DIAGNOSIS — N9489 Other specified conditions associated with female genital organs and menstrual cycle: Secondary | ICD-10-CM

## 2014-09-16 DIAGNOSIS — N8320 Unspecified ovarian cysts: Secondary | ICD-10-CM | POA: Insufficient documentation

## 2014-09-16 DIAGNOSIS — R1909 Other intra-abdominal and pelvic swelling, mass and lump: Secondary | ICD-10-CM | POA: Diagnosis present

## 2014-09-16 MED ORDER — GADOBENATE DIMEGLUMINE 529 MG/ML IV SOLN
20.0000 mL | Freq: Once | INTRAVENOUS | Status: AC | PRN
  Administered 2014-09-16: 18 mL via INTRAVENOUS

## 2014-09-23 ENCOUNTER — Encounter: Payer: Self-pay | Admitting: Gynecology

## 2014-09-23 ENCOUNTER — Ambulatory Visit (INDEPENDENT_AMBULATORY_CARE_PROVIDER_SITE_OTHER): Payer: BLUE CROSS/BLUE SHIELD | Admitting: Gynecology

## 2014-09-23 ENCOUNTER — Other Ambulatory Visit (HOSPITAL_COMMUNITY)
Admission: RE | Admit: 2014-09-23 | Discharge: 2014-09-23 | Disposition: A | Discharge status: Home / Self Care | Payer: BLUE CROSS/BLUE SHIELD | Source: Ambulatory Visit | Attending: Gynecology | Admitting: Gynecology

## 2014-09-23 VITALS — BP 132/86

## 2014-09-23 DIAGNOSIS — R9389 Abnormal findings on diagnostic imaging of other specified body structures: Secondary | ICD-10-CM | POA: Insufficient documentation

## 2014-09-23 DIAGNOSIS — Z01419 Encounter for gynecological examination (general) (routine) without abnormal findings: Secondary | ICD-10-CM | POA: Insufficient documentation

## 2014-09-23 DIAGNOSIS — Z124 Encounter for screening for malignant neoplasm of cervix: Secondary | ICD-10-CM

## 2014-09-23 DIAGNOSIS — N951 Menopausal and female climacteric states: Secondary | ICD-10-CM

## 2014-09-23 DIAGNOSIS — N911 Secondary amenorrhea: Secondary | ICD-10-CM

## 2014-09-23 DIAGNOSIS — Z1151 Encounter for screening for human papillomavirus (HPV): Secondary | ICD-10-CM | POA: Insufficient documentation

## 2014-09-23 DIAGNOSIS — R938 Abnormal findings on diagnostic imaging of other specified body structures: Secondary | ICD-10-CM

## 2014-09-23 LAB — PREGNANCY, URINE: Preg Test, Ur: NEGATIVE

## 2014-09-23 MED ORDER — MEDROXYPROGESTERONE ACETATE 10 MG PO TABS: ORAL_TABLET | ORAL | Status: DC

## 2014-09-23 MED ORDER — ESTRADIOL 1 MG PO TABS: ORAL_TABLET | ORAL | Status: DC

## 2014-09-23 NOTE — Progress Notes (Signed)
Patient presented to the office today to discuss her recent MRI. Patient with long-standing history of secondary menorrhea. She reports her last menstrual cycle the summer of 2015. She denies any vasomotor symptoms. She does have history of hypothyroidism and her primary care physician in [facility] a few days ago. Thyroid panel and was in the normal range and she continues on medication. Patient had a [place] and prolactin the first week in January which were in the normal range. Her pregnancy test was negative and she was prescribed Provera 10 mg to take 1 by mouth daily for 10 days and she did not menstruate. 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 She had 2 successful pregnancies after that resulted in cesarean sections. Patient denied any nipple discharge or unusual headaches or visual disturbances.  An ultrasound was done in the office in January 13 with the following findings: Uterus measured 10.9 x 7.6 x 5.7 cm with endometrial stripe of 11.7 mm. (Last reported menstrual period 11/29/2013). Right ovary had 2 simple cysts echo-free measuring 19 x 18 mm, 16 x 20 mm. 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 There was no fluid in the cul-de-sac.  Because of the above uncertainty of the left adnexa whether it was a hydrosalpinx or a cystic adnexal mass patient underwent an MRI. Her CA 125 was normal. Result of her MRI as follows:  FINDINGS: The uterus is normal in size measuring 11.1 x 5.5 by 5.5 cm. No myometrial abnormalities are demonstrated. There is a C-section scar involving the lower uterine segment anteriorly. The endometrium is normal. The junctional  zone appears normal. Nabothian cysts are noted at the cervix. The largest cyst measures 23 mm.  There are small cysts and follicles associated with the right ovary. No worrisome lesions or abnormal enhancement. The left ovary demonstrates small follicles. Hydrosalpinx is noted and likely accounts for the adnexal mass seen on ultrasound.  The bladder is normal. No pelvic lymphadenopathy. No inguinal mass or adenopathy. The bony pelvis is unremarkable.  IMPRESSION: 1. Left-sided hydrosalpinx accounts for the adnexal mass seen on ultrasound. 2. Bilateral ovarian cysts and follicles. 3. Normal uterus except for nabothian cysts.  These findings were discussed with the patient today. Because of her thickened endometrium and being perimenopausal and no menstrual cycle since 2015 an endometrial biopsy was done in a sterile fashion after the cervix was cleansed with Betadine solution and a sterile Pipelle was introduced into the uterine cavity. The little bit of tissue obtained was admitted for histological evaluation.  Assessment/plan: Secondary amenorrhea perimenopausal no vasomotor symptoms normal FSH and TSH. Patient currently being treated for hypothyroidism. Patient overweight may have contributed to her amenorrhea. Because of her thickened endometrium and her risk of endometrial hyperplasia or endometrial cancer and endometrial biopsy was done. We will notify the patient once a result become available. In the event of an outflow tract abnormality she is going to be challenged with Estrace 1 mg tablet 1 by mouth daily for 25 days with the addition of Provera 10 mg or 10 days. If she does not have menses 2 weeks after this regimen she'll return back to the office and we will recheck her [facility] as well. If  she doesn't menstruate we'll monitor her cycles over the course of the next 3 months and if she once again does not have a cycle she'll return back to the office. Of note a Pap smear was done today.  Also prior to the endometrial biopsy a urine pregnancy test was done which was negative. An endometrial biopsy was done today as well. It was also explained to her that on her left adnexa is a hydrosalpinx and both ovaries otherwise were unremarkable. She is asymptomatic. Patient states her husband would like to have another child. We discussed the high-risk of aneuploidy at the age of 84 as well as maternal hypertension, diabetes as well as immature birth.

## 2014-09-23 NOTE — Addendum Note (Signed)
Addended by: Thurnell Garbe A on: 09/23/2014 12:52 PM   Modules accepted: Orders

## 2014-09-28 LAB — CYTOLOGY - PAP

## 2015-06-04 ENCOUNTER — Encounter: Payer: Self-pay | Admitting: Gynecology

## 2015-06-04 ENCOUNTER — Ambulatory Visit (INDEPENDENT_AMBULATORY_CARE_PROVIDER_SITE_OTHER): Payer: BLUE CROSS/BLUE SHIELD | Admitting: Gynecology

## 2015-06-04 VITALS — BP 134/88 | Ht 60.5 in | Wt 198.0 lb

## 2015-06-04 DIAGNOSIS — N970 Female infertility associated with anovulation: Secondary | ICD-10-CM

## 2015-06-04 DIAGNOSIS — E663 Overweight: Secondary | ICD-10-CM | POA: Diagnosis not present

## 2015-06-04 DIAGNOSIS — Z01419 Encounter for gynecological examination (general) (routine) without abnormal findings: Secondary | ICD-10-CM

## 2015-06-04 MED ORDER — MEDROXYPROGESTERONE ACETATE 10 MG PO TABS
ORAL_TABLET | ORAL | Status: DC
Start: 2015-06-04 — End: 2016-07-21

## 2015-06-04 NOTE — Progress Notes (Signed)
Courtney Foster March 26, 1966 [id number]   History:    49 y.o.  for annual gyn exam with no major complaints except occasional tension headache. Patient with past long-standing history of chronic anovulation probably attributed to her overweight. She also has had history of hypothyroidism whereby she is being monitored by her endocrinologist in [facility]. She was last seen in the office in February this year with the following history noted:  "Patient with long-standing history of secondary menorrhea. She reports her last menstrual cycle the summer of 2015. She denies any vasomotor symptoms. She does have history of hypothyroidism and her primary care physician in [facility] a few days ago. Thyroid panel and was in the normal range and she continues on medication. Patient had a [place] and prolactin the first week in January which were in the normal range. Her pregnancy test was negative and she was prescribed Provera 10 mg to take 1 by mouth daily for 10 days and she did not menstruate. 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 She had 2 successful pregnancies after that resulted in cesarean sections. Patient denied any nipple discharge or unusual headaches or visual disturbances.  An ultrasound was done in the office in January 13 with the following findings: Uterus measured 10.9 x 7.6 x 5.7 cm with endometrial stripe of 11.7 mm. (Last reported menstrual period 11/29/2013). Right ovary had 2 simple cysts echo-free measuring 19 x 18 mm, 16 x 20 mm. 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 There was no fluid in the cul-de-sac.  Because of the above  uncertainty of the left adnexa whether it was a hydrosalpinx or a cystic adnexal mass patient underwent an MRI. Her CA 125 was normal. Result of her MRI as follows:  FINDINGS: The uterus is normal in size measuring 11.1 x 5.5 by 5.5 cm. No myometrial abnormalities are demonstrated. There is a C-section scar involving the lower uterine segment anteriorly. The endometrium is normal. The junctional zone appears normal. Nabothian cysts are noted at the cervix. The largest cyst measures 23 mm.  There are small cysts and follicles associated with the right ovary. No worrisome lesions or abnormal enhancement. The left ovary demonstrates small follicles. Hydrosalpinx is noted and likely accounts for the adnexal mass seen on ultrasound.  The bladder is normal. No pelvic lymphadenopathy. No inguinal mass or adenopathy. The bony pelvis is unremarkable.  IMPRESSION: 1. Left-sided hydrosalpinx accounts for the adnexal mass seen on ultrasound. 2. Bilateral ovarian cysts and follicles. 3. Normal uterus except for nabothian cysts."  Patient had an endometrial biopsy on that day and the following was pathology report: Diagnosis Endometrium, biopsy - DEGENERATING SECRETORY-TYPE ENDOMETRIUM. - THERE IS NO EVIDENCE OF HYPERPLASIA OR MALIGNANCY. - SEE COMMENT. Microscopic Comment This pattern can be associated with menses, irregular shedding or breakthrough bleeding associated with hormonal therapy. Clinical correlation is suggested  Patient had been prescribed Estrace for 25 days with the addition of Provera for 10 days and she menstruated. She stated since January she's had periods on and off some time she was skip a month. Her last report menstrual period for the September. She is not using any form of contraception. Patient with no past history  of any abnormal Pap smear in the past. She has been receiving her mammogram in [facility].  Past medical history,surgical history,  family history and social history were all reviewed and documented in the EPIC chart.  Gynecologic History Patient's last menstrual period was 11/29/2013. Contraception: none Last Pap: 2015. Results were: normal Last mammogram: 2015. Results were: normal  Obstetric History OB History  Gravida Para Term Preterm AB SAB TAB Ectopic Multiple Living  4 2   2 2    2     # Outcome Date GA Lbr Len/2nd Weight Sex Delivery Anes PTL Lv  4 SAB           3 SAB           2 Para           1 Para                ROS: A ROS was performed and pertinent positives and negatives are included in the history.  GENERAL: No fevers or chills. HEENT: No change in vision, no earache, sore throat or sinus congestion. NECK: No pain or stiffness. CARDIOVASCULAR: No chest pain or pressure. No palpitations. PULMONARY: No shortness of breath, cough or wheeze. GASTROINTESTINAL: No abdominal pain, nausea, vomiting or diarrhea, melena or bright red blood per rectum. GENITOURINARY: No urinary frequency, urgency, hesitancy or dysuria. MUSCULOSKELETAL: No joint or muscle pain, no back pain, no recent trauma. DERMATOLOGIC: No rash, no itching, no lesions. ENDOCRINE: No polyuria, polydipsia, no heat or cold intolerance. No recent change in weight. HEMATOLOGICAL: No anemia or easy bruising or bleeding. NEUROLOGIC: No headache, seizures, numbness, tingling or weakness. PSYCHIATRIC: No depression, no loss of interest in normal activity or change in sleep pattern.     Exam: chaperone present  BP 134/88 mmHg  Ht 5' 0.5" (1.537 m)  Wt 198 lb (89.812 kg)  BMI 38.02 kg/m2  LMP 11/29/2013  Body mass index is 38.02 kg/(m^2).  General appearance : Well developed well nourished female. No acute distress HEENT: Eyes: no retinal hemorrhage or exudates,  Neck supple, trachea midline, no carotid bruits, no thyroidmegaly Lungs: Clear to auscultation, no rhonchi or wheezes, or rib retractions  Heart: Regular rate and rhythm, no murmurs  or gallops Breast:Examined in sitting and supine position were symmetrical in appearance, no palpable masses or tenderness,  no skin retraction, no nipple inversion, no nipple discharge, no skin discoloration, no axillary or supraclavicular lymphadenopathy Abdomen: no palpable masses or tenderness, no rebound or guarding Extremities: no edema or skin discoloration or tenderness  Pelvic:  Bartholin, Urethra, Skene Glands: Within normal limits             Vagina: No gross lesions or discharge  Cervix: No gross lesions or discharge  Uterus  anteverted, normal size, shape and consistency, non-tender and mobile  Adnexa  Without masses or tenderness  Anus and perineum  normal   Rectovaginal  normal sphincter tone without palpated masses or tenderness             Hemoccult not indicated     Assessment/Plan:  49 y.o. female for annual exam who is overweight which may be contributing to her chronic anovulation and amenorrhea episodes. For this reason I have prescribed Provera 10 mg her to take for 10 days of the month if she does not have a spontaneous menses providing that she does a home pregnancy test versus and that is negative. She will return to the office next week in a fasting  state for the following screening blood work: Comprehensive metabolic panel, fasting lipid profile, CBC, and urinalysis. Her endocrinologist is been doing her thyroid function test. She's going to have her flu vaccine at work. Patient had a normal FSH and prolactin early this year.   Terrance Mass MD, 11:31 AM 06/04/2015

## 2015-06-07 ENCOUNTER — Other Ambulatory Visit: Payer: BLUE CROSS/BLUE SHIELD

## 2015-06-07 DIAGNOSIS — Z01419 Encounter for gynecological examination (general) (routine) without abnormal findings: Secondary | ICD-10-CM

## 2015-06-07 LAB — CBC WITH DIFFERENTIAL/PLATELET
Basophils Absolute: 0.1 10*3/uL (ref 0.0–0.1)
Basophils Relative: 1 % (ref 0–1)
Eosinophils Absolute: 0.2 10*3/uL (ref 0.0–0.7)
Eosinophils Relative: 3 % (ref 0–5)
HCT: 34.6 % — ABNORMAL LOW (ref 36.0–46.0)
Hemoglobin: 10.9 g/dL — ABNORMAL LOW (ref 12.0–15.0)
Lymphocytes Relative: 46 % (ref 12–46)
Lymphs Abs: 2.6 10*3/uL (ref 0.7–4.0)
MCH: 26 pg (ref 26.0–34.0)
MCHC: 31.5 g/dL (ref 30.0–36.0)
MCV: 82.6 fL (ref 78.0–100.0)
MPV: 9.3 fL (ref 8.6–12.4)
Monocytes Absolute: 0.4 10*3/uL (ref 0.1–1.0)
Monocytes Relative: 8 % (ref 3–12)
Neutro Abs: 2.4 10*3/uL (ref 1.7–7.7)
Neutrophils Relative %: 42 % — ABNORMAL LOW (ref 43–77)
Platelets: 296 10*3/uL (ref 150–400)
RBC: 4.19 MIL/uL (ref 3.87–5.11)
RDW: 15 % (ref 11.5–15.5)
WBC: 5.6 10*3/uL (ref 4.0–10.5)

## 2015-06-08 ENCOUNTER — Other Ambulatory Visit: Payer: Self-pay

## 2015-06-08 ENCOUNTER — Other Ambulatory Visit: Payer: Self-pay | Admitting: Gynecology

## 2015-06-08 DIAGNOSIS — R945 Abnormal results of liver function studies: Principal | ICD-10-CM

## 2015-06-08 DIAGNOSIS — D649 Anemia, unspecified: Secondary | ICD-10-CM

## 2015-06-08 DIAGNOSIS — R7989 Other specified abnormal findings of blood chemistry: Secondary | ICD-10-CM

## 2015-06-08 LAB — COMPREHENSIVE METABOLIC PANEL
ALT: 91 U/L — ABNORMAL HIGH (ref 6–29)
AST: 59 U/L — ABNORMAL HIGH (ref 10–35)
Albumin: 3.9 g/dL (ref 3.6–5.1)
Alkaline Phosphatase: 80 U/L (ref 33–130)
BUN: 12 mg/dL (ref 7–25)
CO2: 27 mmol/L (ref 20–31)
Calcium: 9 mg/dL (ref 8.6–10.4)
Chloride: 105 mmol/L (ref 98–110)
Creat: 0.68 mg/dL (ref 0.50–1.05)
Glucose, Bld: 92 mg/dL (ref 65–99)
Potassium: 4.2 mmol/L (ref 3.5–5.3)
Sodium: 140 mmol/L (ref 135–146)
Total Bilirubin: 0.4 mg/dL (ref 0.2–1.2)
Total Protein: 7.1 g/dL (ref 6.1–8.1)

## 2015-06-08 LAB — LIPID PANEL
Cholesterol: 135 mg/dL (ref 125–200)
HDL: 35 mg/dL — ABNORMAL LOW (ref >=46)
LDL Cholesterol: 83 mg/dL (ref <130)
Total CHOL/HDL Ratio: 3.9 Ratio (ref <=5.0)
Triglycerides: 83 mg/dL (ref <150)
VLDL: 17 mg/dL (ref <30)

## 2015-06-14 ENCOUNTER — Other Ambulatory Visit: Payer: Self-pay | Admitting: Gynecology

## 2015-06-14 DIAGNOSIS — R7989 Other specified abnormal findings of blood chemistry: Secondary | ICD-10-CM

## 2015-06-14 DIAGNOSIS — R945 Abnormal results of liver function studies: Principal | ICD-10-CM

## 2015-06-14 DIAGNOSIS — D649 Anemia, unspecified: Secondary | ICD-10-CM

## 2015-06-15 ENCOUNTER — Encounter: Payer: Self-pay | Admitting: Physician Assistant

## 2015-06-15 ENCOUNTER — Other Ambulatory Visit: Payer: Self-pay | Admitting: Gynecology

## 2015-06-15 ENCOUNTER — Other Ambulatory Visit: Payer: BLUE CROSS/BLUE SHIELD

## 2015-06-15 ENCOUNTER — Telehealth: Payer: Self-pay | Admitting: *Deleted

## 2015-06-15 DIAGNOSIS — R7989 Other specified abnormal findings of blood chemistry: Secondary | ICD-10-CM

## 2015-06-15 DIAGNOSIS — R945 Abnormal results of liver function studies: Principal | ICD-10-CM

## 2015-06-15 DIAGNOSIS — D649 Anemia, unspecified: Secondary | ICD-10-CM

## 2015-06-15 LAB — CBC WITH DIFFERENTIAL/PLATELET
Basophils Absolute: 0.1 10*3/uL (ref 0.0–0.1)
Basophils Relative: 1 % (ref 0–1)
Eosinophils Absolute: 0.1 10*3/uL (ref 0.0–0.7)
Eosinophils Relative: 2 % (ref 0–5)
HCT: 35.9 % — ABNORMAL LOW (ref 36.0–46.0)
Hemoglobin: 11.5 g/dL — ABNORMAL LOW (ref 12.0–15.0)
Lymphocytes Relative: 49 % — ABNORMAL HIGH (ref 12–46)
Lymphs Abs: 3.1 10*3/uL (ref 0.7–4.0)
MCH: 26 pg (ref 26.0–34.0)
MCHC: 32 g/dL (ref 30.0–36.0)
MCV: 81.2 fL (ref 78.0–100.0)
MPV: 9.8 fL (ref 8.6–12.4)
Monocytes Absolute: 0.4 10*3/uL (ref 0.1–1.0)
Monocytes Relative: 7 % (ref 3–12)
Neutro Abs: 2.6 10*3/uL (ref 1.7–7.7)
Neutrophils Relative %: 41 % — ABNORMAL LOW (ref 43–77)
Platelets: 339 10*3/uL (ref 150–400)
RBC: 4.42 MIL/uL (ref 3.87–5.11)
RDW: 14.7 % (ref 11.5–15.5)
WBC: 6.3 10*3/uL (ref 4.0–10.5)

## 2015-06-15 LAB — AST: AST: 43 U/L — ABNORMAL HIGH (ref 10–35)

## 2015-06-15 LAB — ALT: ALT: 67 U/L — ABNORMAL HIGH (ref 6–29)

## 2015-06-15 NOTE — Telephone Encounter (Signed)
Per 06/07/15 note "because of her chronic anemia I would like to refer her to the gastroenterologist" referral placed  Appointment on 07/01/15 @ 9:00am with Amy Esteridge will have claudia to relay

## 2015-06-16 LAB — HEPATITIS PANEL, ACUTE
HCV Ab: NEGATIVE
Hep A IgM: NONREACTIVE
Hep B C IgM: NONREACTIVE
Hepatitis B Surface Ag: NEGATIVE

## 2015-07-01 ENCOUNTER — Ambulatory Visit: Payer: BLUE CROSS/BLUE SHIELD | Admitting: Physician Assistant

## 2015-07-19 ENCOUNTER — Encounter: Payer: Self-pay | Admitting: Gynecology

## 2015-09-10 ENCOUNTER — Ambulatory Visit: Payer: BLUE CROSS/BLUE SHIELD | Admitting: Gastroenterology

## 2016-07-21 ENCOUNTER — Other Ambulatory Visit: Payer: Self-pay | Admitting: Gynecology

## 2016-07-21 ENCOUNTER — Encounter: Payer: Self-pay | Admitting: Gynecology

## 2016-07-21 ENCOUNTER — Ambulatory Visit (INDEPENDENT_AMBULATORY_CARE_PROVIDER_SITE_OTHER): Payer: BLUE CROSS/BLUE SHIELD | Admitting: Gynecology

## 2016-07-21 VITALS — BP 130/78 | Ht 60.5 in | Wt 199.0 lb

## 2016-07-21 DIAGNOSIS — N951 Menopausal and female climacteric states: Secondary | ICD-10-CM | POA: Diagnosis not present

## 2016-07-21 DIAGNOSIS — Z01411 Encounter for gynecological examination (general) (routine) with abnormal findings: Secondary | ICD-10-CM | POA: Diagnosis not present

## 2016-07-21 DIAGNOSIS — E039 Hypothyroidism, unspecified: Secondary | ICD-10-CM | POA: Diagnosis not present

## 2016-07-21 DIAGNOSIS — R635 Abnormal weight gain: Secondary | ICD-10-CM

## 2016-07-21 DIAGNOSIS — Z79890 Hormone replacement therapy (postmenopausal): Secondary | ICD-10-CM

## 2016-07-21 DIAGNOSIS — R7989 Other specified abnormal findings of blood chemistry: Secondary | ICD-10-CM | POA: Diagnosis not present

## 2016-07-21 DIAGNOSIS — R945 Abnormal results of liver function studies: Secondary | ICD-10-CM

## 2016-07-21 DIAGNOSIS — N7011 Chronic salpingitis: Secondary | ICD-10-CM

## 2016-07-21 LAB — COMPREHENSIVE METABOLIC PANEL
ALT: 59 U/L — ABNORMAL HIGH (ref 6–29)
AST: 46 U/L — ABNORMAL HIGH (ref 10–35)
Albumin: 4.2 g/dL (ref 3.6–5.1)
Alkaline Phosphatase: 79 U/L (ref 33–130)
BUN: 13 mg/dL (ref 7–25)
CO2: 26 mmol/L (ref 20–31)
Calcium: 9 mg/dL (ref 8.6–10.4)
Chloride: 102 mmol/L (ref 98–110)
Creat: 0.7 mg/dL (ref 0.50–1.05)
Glucose, Bld: 95 mg/dL (ref 65–99)
Potassium: 4.3 mmol/L (ref 3.5–5.3)
Sodium: 138 mmol/L (ref 135–146)
Total Bilirubin: 0.3 mg/dL (ref 0.2–1.2)
Total Protein: 7.3 g/dL (ref 6.1–8.1)

## 2016-07-21 LAB — CBC WITH DIFFERENTIAL/PLATELET
Basophils Absolute: 0 cells/uL (ref 0–200)
Basophils Relative: 0 %
Eosinophils Absolute: 186 cells/uL (ref 15–500)
Eosinophils Relative: 3 %
HCT: 40.1 % (ref 35.0–45.0)
Hemoglobin: 13.1 g/dL (ref 11.7–15.5)
Lymphocytes Relative: 47 %
Lymphs Abs: 2914 cells/uL (ref 850–3900)
MCH: 28.2 pg (ref 27.0–33.0)
MCHC: 32.7 g/dL (ref 32.0–36.0)
MCV: 86.4 fL (ref 80.0–100.0)
MPV: 10 fL (ref 7.5–12.5)
Monocytes Absolute: 496 cells/uL (ref 200–950)
Monocytes Relative: 8 %
Neutro Abs: 2604 cells/uL (ref 1500–7800)
Neutrophils Relative %: 42 %
Platelets: 277 10*3/uL (ref 140–400)
RBC: 4.64 MIL/uL (ref 3.80–5.10)
RDW: 14.9 % (ref 11.0–15.0)
WBC: 6.2 10*3/uL (ref 3.8–10.8)

## 2016-07-21 LAB — LIPID PANEL
Cholesterol: 149 mg/dL (ref <200)
HDL: 39 mg/dL — ABNORMAL LOW (ref >50)
LDL Cholesterol: 89 mg/dL (ref <100)
Total CHOL/HDL Ratio: 3.8 Ratio (ref <5.0)
Triglycerides: 107 mg/dL (ref <150)
VLDL: 21 mg/dL (ref <30)

## 2016-07-21 LAB — TSH: TSH: 3.17 mIU/L

## 2016-07-21 MED ORDER — ESTRADIOL 1 MG PO TABS: ORAL_TABLET | ORAL | 0 refills | Status: DC

## 2016-07-21 MED ORDER — MEDROXYPROGESTERONE ACETATE 10 MG PO TABS: ORAL_TABLET | ORAL | 0 refills | Status: DC

## 2016-07-21 NOTE — Addendum Note (Signed)
Addended by: Terrance Mass on: 07/21/2016 10:55 AM   Modules accepted: Orders

## 2016-07-21 NOTE — Progress Notes (Signed)
Taiyah Ginther 1965/09/23 [id number]   History:    50 y.o.  for annual gyn exam with several complaints today. She is suffering from hot flashes, irritability, mood swing and decreased libido. Patient last year was going to be started on hormonal replacement therapy which she never initiated because she went to [place]. Patient has had history of bilateral tuboplasty in the past and subsequently had 2 successful pregnancies after that. She does have a history of a left hydrosalpinx which on last ultrasound in January 2016 demonstrated dimension of 7.9 x 7.5 x 4.0 cm. Also review of her record indicated that last year when she had her blood work her liver function tests SGOT and SGPT were elevated and upon repeat it was still elevated. A complete hepatitis panel had been obtained which was negative. Patient has not had a colonoscopy. Patient did not follow up with the gastroenterologists when it was recommended. She has never had any history of any abnormal Pap smears.  Past medical history,surgical history, family history and social history were all reviewed and documented in the EPIC chart.  Gynecologic History Patient's last menstrual period was 11/29/2013. Contraception: none Last Pap: 2016. Results were: normal Last mammogram: 2016. Results were: normal  Obstetric History OB History  Gravida Para Term Preterm AB Living  4 2     2 2   SAB TAB Ectopic Multiple Live Births  2            # Outcome Date GA Lbr Len/2nd Weight Sex Delivery Anes PTL Lv  4 SAB           3 SAB           2 Para           1 Para                ROS: A ROS was performed and pertinent positives and negatives are included in the history.  GENERAL: No fevers or chills. HEENT: No change in vision, no earache, sore throat or sinus congestion. NECK: No pain or stiffness. CARDIOVASCULAR: No chest pain or pressure. No palpitations. PULMONARY: No shortness of breath, cough or wheeze. GASTROINTESTINAL: No abdominal pain,  nausea, vomiting or diarrhea, melena or bright red blood per rectum. GENITOURINARY: No urinary frequency, urgency, hesitancy or dysuria. MUSCULOSKELETAL: No joint or muscle pain, no back pain, no recent trauma. DERMATOLOGIC: No rash, no itching, no lesions. ENDOCRINE: No polyuria, polydipsia, no heat or cold intolerance. No recent change in weight. HEMATOLOGICAL: No anemia or easy bruising or bleeding. NEUROLOGIC: No headache, seizures, numbness, tingling or weakness. PSYCHIATRIC: No depression, no loss of interest in normal activity or change in sleep pattern.     Exam: chaperone present  BP 130/78   Ht 5' 0.5" (1.537 m)   Wt 199 lb (90.3 kg)   LMP 11/29/2013   BMI 38.22 kg/m   Body mass index is 38.22 kg/m.  General appearance : Well developed well nourished female. No acute distress HEENT: Eyes: no retinal hemorrhage or exudates,  Neck supple, trachea midline, no carotid bruits, no thyroidmegaly Lungs: Clear to auscultation, no rhonchi or wheezes, or rib retractions  Heart: Regular rate and rhythm, no murmurs or gallops Breast:Examined in sitting and supine position were symmetrical in appearance, no palpable masses or tenderness,  no skin retraction, no nipple inversion, no nipple discharge, no skin discoloration, no axillary or supraclavicular lymphadenopathy Abdomen: no palpable masses or tenderness, no rebound or guarding Extremities: no edema or skin discoloration  or tenderness  Pelvic:  Bartholin, Urethra, Skene Glands: Within normal limits             Vagina: No gross lesions or discharge  Cervix: No gross lesions or discharge  Uterus  anteverted, normal size, shape and consistency, non-tender and mobile  Adnexa  Without masses or tenderness  Anus and perineum  normal   Rectovaginal  normal sphincter tone without palpated masses or tenderness             Hemoccult will schedule colonoscopy     Assessment/Plan:  50 y.o. female for annual exam with several issues: #1  elevated liver function tests last year and on repeat still elevated SGOT and SGPT arrangements had been made to follow-up with her gastroenterologist. She also has not had her colonoscopy. We'll going to check a compresses a metabolic panel today and if her liver function test still elevated we'll make arrangements once again for her to see the gastroenterologist. Of note her hepatitis panel was negative. #2 patient with signs and symptoms suspicious for being in the menopause. We'll check an [place] today if elevated she will be started on hormone replacement therapy consisting of Estrace 1 mg by mouth daily with the addition of Provera 10 mg for 10 days of the month. The risks benefits and pros and cons of medication were discussed to include the following: DVT, pulmonary embolism, and breast cancer. Women's health initiative study as well as current guidelines were discussed in Spanish and literature information was provided. Patient denies any family history of any DVT or clotting disorders her breast cancer. We discussed importance of monthly breast exams and a requisition was provided for to schedule her mammogram. #3 the following screening blood work was done today in addition to the above tests: Fasting lipid profile, CBC, TSH, and urinalysis. #4 patient with history of hypothyroidism under control on 75 g of levothyroxine daily. #5 we discussed importance of good nutrition and regular exercise. #6 history of left hydrosalpinx and ultrasound will be ordered and this year for follow-up.  Additional 15 minutes was spent with this patient discussing and planning the above.   Terrance Mass MD, 9:48 AM 07/21/2016

## 2016-07-21 NOTE — Patient Instructions (Signed)
Colonoscopia en los adultos, cuidados posteriores (Colonoscopy, Adult, Care After) Aqu encontrar informacin sobre cmo cuidarse despus del procedimiento. El mdico tambin podr darle instrucciones ms especficas. Si tiene problemas o preguntas, llame al mdico. QU ESPERAR DESPUS DEL PROCEDIMIENTO Despus del procedimiento, es comn tener los siguientes sntomas:  Una pequea cantidad de sangre en la materia fecal durante 24horas despus del procedimiento.  Gases.  Clicos abdominales o meteorismo leves. INSTRUCCIONES PARA EL CUIDADO EN EL HOGAR  Instrucciones generales   Durante las primeras 24horas despus del procedimiento:  No conduzca ni opere maquinaria pesada.  No firme documentos importantes.  No beba alcohol.  Realice las actividades cotidianas habituales a un ritmo ms lento que el normal.  Coma alimentos blandos y fciles de Publishing copy.  Descanse con frecuencia.  Tome los medicamentos de venta libre o los recetados solamente como se lo haya indicado el mdico.  Depende de usted retirar los [place] del procedimiento. Pregntele al mdico o consulte en el departamento donde se realice el procedimiento cundo estarn [employer]. Alivio de los clicos y del meteorismo   Cuando tenga clicos o se sienta hinchado, intente caminar por la casa.  Aplquese calor en el abdomen como se lo haya indicado el mdico. Use la fuente de calor que el mdico le recomiende, como una compresa de calor hmedo o una almohadilla trmica.  Coloque una toalla entre la piel y la fuente de Freight forwarder.  Aplique el calor durante 20 a 76minutos.  Retire la fuente de calor si la piel se le pone de color rojo brillante. Esto es muy importante si no puede sentir el dolor, el calor o el fro. Puede correr un riesgo mayor de sufrir quemaduras. Comida y bebida   Beba suficiente lquido para mantener la orina clara o de color amarillo plido.  Retome su dieta normal como se lo haya  indicado el mdico. Evite los alimentos pesados o fritos que son difciles de Publishing copy.  Evite consumir alcohol durante el tiempo que le haya indicado el mdico. SOLICITE ATENCIN MDICA SI:  Tiene sangre en la materia fecal 2 o 3das despus del procedimiento. SOLICITE ATENCIN MDICA DE INMEDIATO SI:  Tiene ms que una pequea mancha de sangre en la materia fecal.  Elimina grandes cogulos de sangre en la materia fecal.  Tiene el abdomen inflamado.  Tiene nuseas o vmitos.  Tiene fiebre.  Siente dolor intenso en el abdomen que no se alivia con los [employer]. Esta informacin no tiene Marine scientist el consejo del mdico. Asegrese de hacerle al mdico cualquier pregunta que tenga. Document Released: 11/03/2008 Document Revised: 05/28/2013 Document Reviewed: 10/19/2015 Elsevier Interactive Patient Education  2017 [employer].

## 2016-07-22 LAB — URINALYSIS W MICROSCOPIC + REFLEX CULTURE
Bacteria, UA: NONE SEEN [HPF]
Bilirubin Urine: NEGATIVE
Casts: NONE SEEN [LPF]
Crystals: NONE SEEN [HPF]
Glucose, UA: NEGATIVE
Hgb urine dipstick: NEGATIVE
Ketones, ur: NEGATIVE
Leukocytes, UA: NEGATIVE
Nitrite: NEGATIVE
Protein, ur: NEGATIVE
Specific Gravity, Urine: 1.023 (ref 1.001–1.035)
WBC, UA: NONE SEEN WBC/HPF (ref <=5)
Yeast: NONE SEEN [HPF]
pH: 7 (ref 5.0–8.0)

## 2016-07-22 LAB — FOLLICLE STIMULATING HORMONE: FSH: 6.5 m[IU]/mL

## 2016-07-24 ENCOUNTER — Telehealth: Payer: Self-pay | Admitting: *Deleted

## 2016-07-24 LAB — HEPATITIS PANEL, ACUTE
HCV Ab: NEGATIVE
Hep A IgM: NONREACTIVE
Hep B C IgM: NONREACTIVE
Hepatitis B Surface Ag: NEGATIVE

## 2016-07-24 LAB — URINE CULTURE

## 2016-07-24 NOTE — Telephone Encounter (Signed)
I will route this call to [address] to relay, [place] this patient is in result notes as well regarding labs

## 2016-07-24 NOTE — Telephone Encounter (Signed)
We did not have the repeat labs when prescribed. Tell her we will not precriber HRT until she sees GI. She can use Peppermint Oil apply behind the ears PRN.

## 2016-07-24 NOTE — Telephone Encounter (Signed)
[facility] pharmacy called they received her Rx for estradiol 1 mg and medroxyprogesterone 10 mg in the pharmacy system contraindication that patient has fatty liver disease Pharmacist asked if okay to proceed with both Rx? Please advise

## 2016-07-25 NOTE — Telephone Encounter (Signed)
Patient was informed of message below. She has an appointment to see the GI in 2 weeks to follow up on her elevated LFT's.

## 2016-07-26 ENCOUNTER — Other Ambulatory Visit: Payer: Self-pay | Admitting: Gynecology

## 2016-07-26 DIAGNOSIS — R3129 Other microscopic hematuria: Secondary | ICD-10-CM

## 2016-07-27 ENCOUNTER — Telehealth: Payer: Self-pay | Admitting: *Deleted

## 2016-07-27 NOTE — Telephone Encounter (Signed)
Per [employer] [place] covered at 70% until deductible/OPM met. Not met yet. Owes $125.15 for US/visit KW

## 2016-08-07 ENCOUNTER — Other Ambulatory Visit: Payer: Self-pay | Admitting: Gynecology

## 2016-08-07 ENCOUNTER — Ambulatory Visit (INDEPENDENT_AMBULATORY_CARE_PROVIDER_SITE_OTHER): Payer: BLUE CROSS/BLUE SHIELD

## 2016-08-07 ENCOUNTER — Encounter: Payer: Self-pay | Admitting: Nurse Practitioner

## 2016-08-07 ENCOUNTER — Telehealth: Payer: Self-pay | Admitting: *Deleted

## 2016-08-07 ENCOUNTER — Ambulatory Visit (INDEPENDENT_AMBULATORY_CARE_PROVIDER_SITE_OTHER): Payer: BLUE CROSS/BLUE SHIELD | Admitting: Gynecology

## 2016-08-07 ENCOUNTER — Encounter: Payer: Self-pay | Admitting: Gynecology

## 2016-08-07 DIAGNOSIS — Z1211 Encounter for screening for malignant neoplasm of colon: Secondary | ICD-10-CM

## 2016-08-07 DIAGNOSIS — R9389 Abnormal findings on diagnostic imaging of other specified body structures: Secondary | ICD-10-CM

## 2016-08-07 DIAGNOSIS — N949 Unspecified condition associated with female genital organs and menstrual cycle: Secondary | ICD-10-CM | POA: Diagnosis not present

## 2016-08-07 DIAGNOSIS — N9489 Other specified conditions associated with female genital organs and menstrual cycle: Secondary | ICD-10-CM

## 2016-08-07 DIAGNOSIS — N911 Secondary amenorrhea: Secondary | ICD-10-CM | POA: Diagnosis not present

## 2016-08-07 DIAGNOSIS — R938 Abnormal findings on diagnostic imaging of other specified body structures: Secondary | ICD-10-CM

## 2016-08-07 DIAGNOSIS — R7989 Other specified abnormal findings of blood chemistry: Secondary | ICD-10-CM

## 2016-08-07 DIAGNOSIS — N7011 Chronic salpingitis: Secondary | ICD-10-CM

## 2016-08-07 DIAGNOSIS — E663 Overweight: Secondary | ICD-10-CM

## 2016-08-07 DIAGNOSIS — N83201 Unspecified ovarian cyst, right side: Secondary | ICD-10-CM

## 2016-08-07 DIAGNOSIS — N83202 Unspecified ovarian cyst, left side: Secondary | ICD-10-CM

## 2016-08-07 DIAGNOSIS — Z1231 Encounter for screening mammogram for malignant neoplasm of breast: Secondary | ICD-10-CM

## 2016-08-07 DIAGNOSIS — R945 Abnormal results of liver function studies: Principal | ICD-10-CM

## 2016-08-07 LAB — PREGNANCY, URINE: Preg Test, Ur: NEGATIVE

## 2016-08-07 NOTE — Addendum Note (Signed)
Addended by: Thurnell Garbe A on: 08/07/2016 02:21 PM   Modules accepted: Orders

## 2016-08-07 NOTE — Telephone Encounter (Signed)
-----   Message from Terrance Mass, MD sent at 08/07/2016  1:16 PM EST ----- Please schedule consult appointment with [facility] for this patient with elevated LFT's and who has not had a colonoscopy yet as well

## 2016-08-07 NOTE — Progress Notes (Signed)
   Patient is a 50 year old was seen in the office on December 1 for her annual exam and had been complaining of hot flashes, irritability, mood swings and decreased libido.Patient has had history of bilateral tuboplasty in the past and subsequently had 2 successful pregnancies after that. She does have a history of a left hydrosalpinx which on last ultrasound in January 2016 demonstrated dimension of 7.9 x 7.5 x 4.0 cm. Also review of her record indicated that last year when she had her blood work her liver function tests SGOT and SGPT were elevated and upon repeat it was still elevated. A complete hepatitis panel had been obtained which was negative. Patient has not had a colonoscopy. Patient did not follow up with the gastroenterologists when it was recommended. She states that she has not had a menstrual cycle since April of last year. She denies any unusual headaches or visual disturbances or nipple discharge. She does have history of hypothyroidism. On last office visit her TSH was normal, and her FSH was normal and her CBC and urinalysis. Her SGOT and SGPT was found to be elevated once again with a value of 46 and 59 respectively.  She is here today to discuss her ultrasound and to follow-up on her hydrosalpinx that was noted and 2016. We did a urine pregnancy test before proceeding with an endometrial biopsy as well because of risk for hyperplasia or endometrial cancer.  Ultrasound uterus measured 11.5 x 7.5 x 6.3 cm with endometrial stripe of 13.1 mm. Endometrial fluid-filled was noted and nabothian cyst 27 x 20 mm was noted. Her right ovary echo-free cyst was noted 3 of them one measured 23 x 21 mm, the second one 13 x 13 mm, the third one 27 x 20 mm. A left ovarian cyst 23 x 18 mm was noted along with a cystic tubular mass forming upon itself measures 6.8 x 3.2 x 6.7 cm consistent with hydrosalpinx. There was negative color flow. No fluid in the cul-de-sac.  Assessment/plan: #1 overweight patient  with secondary amenorrhea normal TSH and FSH. We'll check prolactin level today. Because of endometrial thickness and endometrial biopsy was done after the cervix was cleansed with Betadine solution and a sterile Pipelle was introduced in the intrauterine cavity. Several passes were required to obtain tissue to submit for histological evaluation. #2 bilateral benign-appearing cyst and left hydrosalpinx we'll check a CA 125 limitations discussed with the patient. #3 elevated liver function test and appointment will be made for her to see the gastroenterologist for further evaluation an overdue colonoscopy.  We will notify the patient with the results of the blood tests as well as the endometrial biopsy. We are going to see her in consultation after she sees a gastroenterologist we'll hold off on any hormone treatment such as progestational agent until GI evaluation has been completed for her elevated liver function test. We also discussed sometime in the spring to consider laparoscopic bilateral salpingo-oophorectomy. All the other information was provided in [place].

## 2016-08-07 NOTE — Patient Instructions (Signed)
Biopsia de endometrio - Cuidados posteriores  (Endometrial Biopsy, Care After)  Siga estas instrucciones durante las próximas semanas. Estas indicaciones le proporcionan información general acerca de cómo deberá cuidarse después del procedimiento. El médico también podrá darle instrucciones más específicas. El tratamiento se ha planificado de acuerdo a las prácticas médicas actuales, pero a veces se producen problemas. Comuníquese con el médico si tiene algún problema o tiene dudas después del procedimiento.  QUÉ ESPERAR DESPUÉS DEL PROCEDIMIENTO  Después del procedimiento, es típico tener las siguientes sensaciones:  · Sentirá cólicos leves y tendrá una pequeña cantidad de sangrado vaginal durante algunos días después del procedimiento. Esto es normal.  INSTRUCCIONES PARA EL CUIDADO EN EL HOGAR  · Tome sólo medicamentos de venta libre o recetados, según las indicaciones del médico.  · No utilice tampones, duchas vaginales ni tenga relaciones sexuales hasta que el profesional la autorice.  · Siga las indicaciones del médico relacionadas con la restricción a ciertas actividades, como ejercicios físicos intensos o levantar objetos pesados.    SOLICITE ATENCIÓN MÉDICA SI:  · Tiene un sangrado abundante o sangra durante más de 2 días después del procedimiento.  · Advierte un olor fétido que proviene de la vagina.  · Siente escalofríos o tiene fiebre.  · Siente un dolor en el bajo vientre (abdominal) muy intenso.    SOLICITE ATENCIÓN MÉDICA DE INMEDIATO SI:  · Siente cólicos intensos en el estómago o en la espalda.  · Elimina coágulos grandes.  · La hemorragia aumenta.  · Se siente mareada, débil, o se desmaya.    Esta información no tiene como fin reemplazar el consejo del médico. Asegúrese de hacerle al médico cualquier pregunta que tenga.  Document Released: 05/28/2013 Document Revised: 05/28/2013 Document Reviewed: 01/22/2013  Elsevier Interactive Patient Education © 2017 Elsevier Inc.

## 2016-08-07 NOTE — Telephone Encounter (Signed)
Referral placed they will contact pt to schedule. 

## 2016-08-08 LAB — CA 125: CA 125: 12 U/mL (ref <35)

## 2016-08-08 LAB — PROLACTIN: Prolactin: 18.5 ng/mL

## 2016-08-08 NOTE — Telephone Encounter (Signed)
Appointment 08/23/16 @ 9:30am with PA Chester Holstein

## 2016-08-18 ENCOUNTER — Ambulatory Visit
Admission: RE | Admit: 2016-08-18 | Discharge: 2016-08-18 | Disposition: A | Discharge status: Home / Self Care | Payer: BLUE CROSS/BLUE SHIELD | Source: Ambulatory Visit | Attending: Gynecology | Admitting: Gynecology

## 2016-08-18 DIAGNOSIS — Z1231 Encounter for screening mammogram for malignant neoplasm of breast: Secondary | ICD-10-CM

## 2016-08-23 ENCOUNTER — Encounter (INDEPENDENT_AMBULATORY_CARE_PROVIDER_SITE_OTHER): Payer: Self-pay

## 2016-08-23 ENCOUNTER — Encounter: Payer: Self-pay | Admitting: Nurse Practitioner

## 2016-08-23 ENCOUNTER — Ambulatory Visit (INDEPENDENT_AMBULATORY_CARE_PROVIDER_SITE_OTHER): Payer: BLUE CROSS/BLUE SHIELD | Admitting: Nurse Practitioner

## 2016-08-23 ENCOUNTER — Other Ambulatory Visit (INDEPENDENT_AMBULATORY_CARE_PROVIDER_SITE_OTHER): Payer: BLUE CROSS/BLUE SHIELD

## 2016-08-23 VITALS — BP 120/68 | HR 60 | Ht 65.0 in | Wt 200.8 lb

## 2016-08-23 DIAGNOSIS — R7989 Other specified abnormal findings of blood chemistry: Secondary | ICD-10-CM

## 2016-08-23 DIAGNOSIS — R945 Abnormal results of liver function studies: Principal | ICD-10-CM

## 2016-08-23 DIAGNOSIS — Z1211 Encounter for screening for malignant neoplasm of colon: Secondary | ICD-10-CM

## 2016-08-23 DIAGNOSIS — K5909 Other constipation: Secondary | ICD-10-CM

## 2016-08-23 LAB — FERRITIN: Ferritin: 12.8 ng/mL (ref 10.0–291.0)

## 2016-08-23 MED ORDER — NA SULFATE-K SULFATE-MG SULF 17.5-3.13-1.6 GM/177ML PO SOLN: ORAL | 0 refills | Status: DC

## 2016-08-23 NOTE — Progress Notes (Signed)
Agree with assessment and plan as outlined. If she can afford [place] that would be the best way to evaluate her liver, I suspect she likely has fatty liver driving this process and needs to lose weight to normalize BMI. I will discuss further with her at time of colonoscopy. Thanks

## 2016-08-23 NOTE — Progress Notes (Signed)
HPI: Patient is a 51 year old non-English-speaking female from [place]. She has resided in the [place] for greater than 25 years. Patient accompanied by a Administrator, sports. She is referred by gynecologist Dr.Juan Toney Rakes for evaluation of abnormal liver function studies and also to discuss screening colonoscopy.   Patient has never had a colonoscopy. She has struggled with constipation over the last year and uses MiraLAX and prunes as needed. No rectal bleeding.  Since learning about elevated LFTs patient has felt discomfort in left upper quadrant where she thought liver was located. Left upper quadrant feels swollen. Discomfort unrelated to meals. She is unable to recall if there is relief with evacuation of bowels. No nausea or unexpected weight loss.  In October 2016 patient had mild transaminitis on 2 occasions. AST 40-50 and ALT 60 to 90 range. LFTs rechecked in December of this year and still elevated with AST of 46 and ALT 59. Remaining LFTs normal. Cholesterol 149, triglycerides 107. Acute hepatitis panel negative. Patient takes no over-the-counter medication or herbs. She does not drink alcohol. No family history of liver disease.    Past Medical History:  Diagnosis Date  . Arthritis   . Thyroid disease      Past Surgical History:  Procedure Laterality Date  . CESAREAN SECTION     X2   History reviewed. No pertinent family history. Social History  Substance Use Topics  . Smoking status: Never Smoker  . Smokeless tobacco: Never Used  . Alcohol use No   Current Outpatient Prescriptions  Medication Sig Dispense Refill  . levothyroxine (SYNTHROID, LEVOTHROID) 75 MCG tablet Take 75 mcg by mouth daily before breakfast.    . Na Sulfate-K Sulfate-Mg Sulf (SUPREP BOWEL PREP KIT) 17.5-3.13-1.6 GM/180ML SOLN Use as directed 2 Bottle 0   No current facility-administered medications for this visit.    No Known Allergies   Review of Systems: All systems reviewed  and negative except where noted in HPI.    Physical Exam: BP 120/68   Pulse 60   Ht 5' 5"  (1.651 m)   Wt 200 lb 12.8 oz (91.1 kg)   LMP 11/29/2013   BMI 33.41 kg/m  Constitutional:  Obese Hispanic female in no acute distress. Psychiatric: Normal mood and affect. Behavior is normal. HEENT: Normocephalic and atraumatic. Conjunctivae are normal. No scleral icterus. Neck supple.  Cardiovascular: Normal rate, regular rhythm.  Pulmonary/chest: Effort normal and breath sounds normal. No wheezing, rales or rhonchi. Abdominal: Soft, obese, nontender. Bowel sounds active throughout. There are no masses palpable. No hepatomegaly. Extremities: no edema Lymphadenopathy: No cervical adenopathy noted. Neurological: Alert and oriented to person place and time. Skin: Skin is warm and dry. No rashes noted.   ASSESSMENT AND PLAN:  Colon cancer screening. Patient will be scheduled for a colonoscopy with possible polypectomy.  The risks and benefits of the procedure were discussed with the patient via [place] and the patient agrees to proceed.   Mild transaminitis. She is at risk for fatty liver disease.  -Ultrasound of liver - she is concerned about insurance coverage / her portion of payment.  -In light of her concern about cost, will obtain minimal labs today to include ANA, ASMA. Recent viral hepatitis panel negative.  -we discussed need to weight loss despite whether this turns out to be NASH. She inquires about how to lose weight. Start with carb reduction, portion control, exercise if able.   Constipation, relieved with MiraLAX and prunes as needed. She has left upper  quadrant discomfort described as feeling swollen. I recommended she get bowels moving and see if this helps. She will take MiraLAX every day now  Obesity. BMI 33. As above  Menopause symptoms. GYN has held off on hormone replacement pending liver workup.   Bilateral ovarian cyst and left hydrosalpinx, considering  laparoscopic bilateral salpingo-oophorectomy in the Spring  Tye Savoy, NP  08/23/2016, 11:30 AM  Terrance Mass, MD

## 2016-08-23 NOTE — Patient Instructions (Addendum)
Estreimiento - Adultos (Constipation, Adult) Estreimiento significa que una persona tiene menos de tres evacuaciones en una semana, dificultad para defecar, o que las heces son secas, duras, o ms grandes que lo normal. A medida que envejecemos el estreimiento es ms comn. Una dieta baja en fibra, no tomar suficientes lquidos y el uso de ciertos medicamentos pueden Agricultural engineer.  CAUSAS   Ciertos medicamentos, como los antidepresivos, analgsicos, suplementos de hierro, anticidos y diurticos.  Algunas enfermedades, como la diabetes, el sndrome del colon irritable, enfermedad de la tiroides, o depresin.  No beber suficiente agua.  No consumir suficientes alimentos ricos en fibra.  Situaciones de estrs o viajes.  Falta de actividad fsica o de ejercicio.  Ignorar la necesidad sbita de Landscape architect.  Uso en exceso de laxantes. SIGNOS Y SNTOMAS   Defecar menos de tres veces por semana.  Dificultad para defecar.  Tener las heces secas y duras, o ms grandes que las normales.  Sensacin de estar lleno o hinchado.  Dolor en la parte baja del abdomen.  No sentir alivio despus de defecar. DIAGNSTICO  El mdico le har una historia clnica y un examen fsico. Pueden hacerle exmenes adicionales para el estreimiento grave. Estos estudios pueden ser:  Un radiografa con enema de bario para examinar el recto, el colon y, en algunos casos, el intestino delgado.  Una sigmoidoscopia para examinar el colon inferior.  Una colonoscopia para examinar todo el colon. TRATAMIENTO  El tratamiento depender de la gravedad del estreimiento y de la causa. Algunos tratamientos nutricionales son beber ms lquidos y comer ms alimentos ricos en fibra. El cambio en el estilo de vida incluye hacer ejercicios de [place] regular. Si estas recomendaciones para Animator dieta y en el estilo de vida no ayudan, el mdico le puede indicar el uso de laxantes de venta libre  para ayudarlo a Landscape architect. Los medicamentos recetados se pueden prescribir si los medicamentos de venta libre no lo [place].  INSTRUCCIONES PARA EL CUIDADO EN EL HOGAR   Consuma alimentos con alto contenido de [place], como frutas, vegetales, cereales integrales y porotos.  Limite los alimentos procesados ricos en grasas y azcar, como las papas fritas, hamburguesas, galletas, dulces y refrescos.  Puede agregar un suplemento de fibra a su dieta si no obtiene lo suficiente de los alimentos.  Beba suficiente lquido para Consulting civil engineer orina clara o de color amarillo plido.  Haga ejercicio regularmente o segn las indicaciones del mdico.  Vaya al bao cuando sienta la necesidad de ir. No se aguante las ganas.  Tome solo medicamentos de venta libre o recetados, segn las indicaciones del mdico. No tome otros medicamentos para el estreimiento sin consultarlo antes con su mdico. SOLICITE ATENCIN MDICA DE INMEDIATO SI:   Observa sangre brillante en las heces.  El estreimiento dura ms de 4 das o [place].  Siente dolor abdominal o rectal.  Las heces son delgadas como un lpiz.  Pierde peso de [place] inexplicable. ASEGRESE DE QUE:   Comprende estas instrucciones.  Controlar su afeccin.  Recibir ayuda de inmediato si no mejora o si empeora. Esta informacin no tiene Marine scientist el consejo del mdico. Asegrese de hacerle al mdico cualquier pregunta que tenga. Document Released: 08/27/2007 Document Revised: 08/28/2014 Elsevier Interactive Patient Education  [address] have been scheduled for a colonoscopy. Please follow written instructions given to you at your visit today.  Please pick up your prep supplies at the pharmacy within the next 1-3 days.  If you use inhalers (even only as needed), please bring them with you on the day of your procedure.   You have been scheduled for an abdominal ultrasound at [facility] (1st floor of hospital) on  09/11/16 at 9:30AM. Please arrive 15 minutes prior to your appointment for registration. Make certain not to have anything to eat or drink 6 hours prior to your appointment. Should you need to reschedule your appointment, please contact radiology at [phone]. This test typically takes about 30 minutes to perform.    Your physician has requested that you go to the basement for lab work before leaving today.    Take one dose of Miralax daily.    I appreciate the opportunity to care for you.

## 2016-08-24 LAB — ANA: Anti Nuclear Antibody(ANA): NEGATIVE

## 2016-08-28 ENCOUNTER — Ambulatory Visit: Payer: BLUE CROSS/BLUE SHIELD | Admitting: Physician Assistant

## 2016-08-28 LAB — ANTI-SMOOTH MUSCLE ANTIBODY, IGG: Smooth Muscle Ab: <20 U (ref <20)

## 2016-09-11 ENCOUNTER — Ambulatory Visit (HOSPITAL_COMMUNITY)
Admission: RE | Admit: 2016-09-11 | Discharge: 2016-09-11 | Disposition: A | Discharge status: Home / Self Care | Payer: BLUE CROSS/BLUE SHIELD | Source: Ambulatory Visit | Attending: Nurse Practitioner | Admitting: Nurse Practitioner

## 2016-09-11 DIAGNOSIS — R945 Abnormal results of liver function studies: Secondary | ICD-10-CM

## 2016-09-11 DIAGNOSIS — R7989 Other specified abnormal findings of blood chemistry: Secondary | ICD-10-CM | POA: Diagnosis present

## 2016-09-20 ENCOUNTER — Other Ambulatory Visit: Payer: Self-pay

## 2016-09-20 DIAGNOSIS — R7989 Other specified abnormal findings of blood chemistry: Secondary | ICD-10-CM

## 2016-09-20 DIAGNOSIS — R945 Abnormal results of liver function studies: Principal | ICD-10-CM

## 2016-10-18 ENCOUNTER — Encounter: Payer: Self-pay | Admitting: Gastroenterology

## 2016-10-24 ENCOUNTER — Other Ambulatory Visit (INDEPENDENT_AMBULATORY_CARE_PROVIDER_SITE_OTHER): Payer: BLUE CROSS/BLUE SHIELD

## 2016-10-24 DIAGNOSIS — R7989 Other specified abnormal findings of blood chemistry: Secondary | ICD-10-CM

## 2016-10-24 DIAGNOSIS — R945 Abnormal results of liver function studies: Principal | ICD-10-CM

## 2016-10-24 LAB — HEPATIC FUNCTION PANEL
ALT: 45 U/L — ABNORMAL HIGH (ref 0–35)
AST: 32 U/L (ref 0–37)
Albumin: 4.3 g/dL (ref 3.5–5.2)
Alkaline Phosphatase: 85 U/L (ref 39–117)
Bilirubin, Direct: 0.1 mg/dL (ref 0.0–0.3)
Total Bilirubin: 0.3 mg/dL (ref 0.2–1.2)
Total Protein: 7.9 g/dL (ref 6.0–8.3)

## 2016-10-31 ENCOUNTER — Ambulatory Visit (AMBULATORY_SURGERY_CENTER): Payer: BLUE CROSS/BLUE SHIELD | Admitting: Gastroenterology

## 2016-10-31 VITALS — BP 128/74 | HR 66 | Temp 98.4°F | Resp 15 | Ht 65.0 in | Wt 200.0 lb

## 2016-10-31 DIAGNOSIS — K635 Polyp of colon: Secondary | ICD-10-CM

## 2016-10-31 DIAGNOSIS — K621 Rectal polyp: Secondary | ICD-10-CM

## 2016-10-31 DIAGNOSIS — Z1211 Encounter for screening for malignant neoplasm of colon: Secondary | ICD-10-CM

## 2016-10-31 DIAGNOSIS — D122 Benign neoplasm of ascending colon: Secondary | ICD-10-CM

## 2016-10-31 DIAGNOSIS — Z1212 Encounter for screening for malignant neoplasm of rectum: Secondary | ICD-10-CM

## 2016-10-31 DIAGNOSIS — D128 Benign neoplasm of rectum: Secondary | ICD-10-CM

## 2016-10-31 MED ORDER — SODIUM CHLORIDE 0.9 % IV SOLN: 500.0000 mL | INTRAVENOUS | Status: DC

## 2016-10-31 NOTE — Progress Notes (Signed)
A and O x3. Report to RN. Tolerated MAC anesthesia well.

## 2016-10-31 NOTE — Progress Notes (Signed)
Called to room to assist during endoscopic procedure.  Patient ID and intended procedure confirmed with present staff. Received instructions for my participation in the procedure from the performing physician.  

## 2016-10-31 NOTE — Progress Notes (Signed)
Pt's states no medical or surgical changes since previsit or office visit. 

## 2016-10-31 NOTE — Patient Instructions (Signed)
Handout given on polyps  YOU HAD AN ENDOSCOPIC PROCEDURE TODAY: Refer to the procedure report and other information in the discharge instructions given to you for any specific questions about what was found during the examination. If this information does not answer your questions, please call [place] office at [phone] to clarify.   YOU SHOULD EXPECT: Some feelings of bloating in the abdomen. Passage of more gas than usual. Walking can help get rid of the air that was put into your GI tract during the procedure and reduce the bloating. If you had a lower endoscopy (such as a colonoscopy or flexible sigmoidoscopy) you may notice spotting of blood in your stool or on the toilet paper. Some abdominal soreness may be present for a day or two, also.  DIET: Your first meal following the procedure should be a light meal and then it is ok to progress to your normal diet. A half-sandwich or bowl of soup is an example of a good first meal. Heavy or fried foods are harder to digest and may make you feel nauseous or bloated. Drink plenty of fluids but you should avoid alcoholic beverages for 24 hours. If you had a esophageal dilation, please see attached instructions for diet.    ACTIVITY: Your care partner should take you home directly after the procedure. You should plan to take it easy, moving slowly for the rest of the day. You can resume normal activity the day after the procedure however YOU SHOULD NOT DRIVE, use power tools, machinery or perform tasks that involve climbing or major physical exertion for 24 hours (because of the sedation medicines used during the test).   SYMPTOMS TO REPORT IMMEDIATELY: A gastroenterologist can be reached at any hour. Please call [phone]  for any of the following symptoms:  Following lower endoscopy (colonoscopy, flexible sigmoidoscopy) Excessive amounts of blood in the stool  Significant tenderness, worsening of abdominal pains  Swelling of the abdomen that is  new, acute  Fever of 100 or higher    FOLLOW UP:  If any biopsies were taken you will be contacted by phone or by letter within the next 1-3 weeks. Call [phone]  if you have not heard about the biopsies in 3 weeks.  Please also call with any specific questions about appointments or follow up tests.  

## 2016-10-31 NOTE — Op Note (Signed)
[place] Patient Name: Courtney Foster Procedure Date: 10/31/2016 10:42 AM MRN: [id number] Endoscopist: Remo Lipps P. Tiare Rohlman MD, MD Age: 51 Referring MD:  Date of Birth: [birth year] Gender: Female Account #: [id number] Procedure:                Colonoscopy Indications:              Screening for colorectal malignant neoplasm, This                            is the patient's first colonoscopy Medicines:                Monitored Anesthesia Care Procedure:                Pre-Anesthesia Assessment:                           - Prior to the procedure, a History and Physical                            was performed, and patient medications and                            allergies were reviewed. The patient's tolerance of                            previous anesthesia was also reviewed. The risks                            and benefits of the procedure and the sedation                            options and risks were discussed with the patient.                            All questions were answered, and informed consent                            was obtained. Prior Anticoagulants: The patient has                            taken no previous anticoagulant or antiplatelet                            agents. ASA Grade Assessment: II - A patient with                            mild systemic disease. After reviewing the risks                            and benefits, the patient was deemed in                            satisfactory condition to undergo the procedure.  After obtaining informed consent, the colonoscope                            was passed under direct vision. Throughout the                            procedure, the patient's blood pressure, pulse, and                            oxygen saturations were monitored continuously. The                            Model PCF-H190DL [id number]) scope was introduced                            through the anus  and advanced to the the cecum,                            identified by appendiceal orifice and ileocecal                            valve. The colonoscopy was performed without                            difficulty. The patient tolerated the procedure                            well. The quality of the bowel preparation was                            good. The ileocecal valve, appendiceal orifice, and                            rectum were photographed. Scope In: 10:43:14 AM Scope Out: 11:00:21 AM Scope Withdrawal Time: 0 hours 15 minutes 20 seconds  Total Procedure Duration: 0 hours 17 minutes 7 seconds  Findings:                 The perianal and digital rectal examinations were                            normal.                           A 4 mm polyp was found in the ascending colon. The                            polyp was sessile. The polyp was removed with a                            cold snare. Resection and retrieval were complete.                           A 5 mm polyp was found in the rectum, just proximal  to the dentate line. The polyp was flat. The polyp                            was removed with a cold snare. Resection and                            retrieval were complete.                           A few small-mouthed diverticula were found in the                            left colon.                           Internal hemorrhoids were found during retroflexion.                           The exam was otherwise without abnormality. Complications:            No immediate complications. Estimated blood loss:                            Minimal. Estimated Blood Loss:     Estimated blood loss was minimal. Impression:               - One 4 mm polyp in the ascending colon, removed                            with a cold snare. Resected and retrieved.                           - One 5 mm polyp in the rectum, removed with a cold                             snare. Resected and retrieved.                           - Diverticulosis in the left colon.                           - Internal hemorrhoids.                           - The examination was otherwise normal. Recommendation:           - Patient has a contact number available for                            emergencies. The signs and symptoms of potential                            delayed complications were discussed with the                            patient. Return to normal activities  tomorrow.                            Written discharge instructions were provided to the                            patient.                           - Resume previous diet.                           - Continue present medications.                           - No ibuprofen, naproxen, or other non-steroidal                            anti-inflammatory drugs for 2 weeks after polyp                            removal.                           - Await pathology results.                           - Repeat colonoscopy is recommended for                            surveillance. The colonoscopy date will be                            determined after pathology results from today's                            exam become available for review. Remo Lipps P. Aleia Larocca MD, MD 10/31/2016 11:05:10 AM This report has been signed electronically.

## 2016-11-01 ENCOUNTER — Telehealth: Payer: Self-pay | Admitting: *Deleted

## 2016-11-01 ENCOUNTER — Telehealth: Payer: Self-pay

## 2016-11-01 NOTE — Telephone Encounter (Signed)
  Follow up Call-  Call back number 10/31/2016  Post procedure Call Back phone  # [phone]  Permission to leave phone message Yes  Some recent data might be hidden

## 2016-11-01 NOTE — Telephone Encounter (Signed)
  Follow up Call-  Call back number 10/31/2016  Post procedure Call Back phone  # [phone]  Permission to leave phone message Yes  Some recent data might be hidden   no answer

## 2016-11-07 ENCOUNTER — Encounter: Payer: Self-pay | Admitting: Gastroenterology

## 2016-11-14 ENCOUNTER — Ambulatory Visit: Payer: BLUE CROSS/BLUE SHIELD | Admitting: Gastroenterology

## 2017-01-03 ENCOUNTER — Encounter: Payer: Self-pay | Admitting: Gynecology

## 2017-08-11 ENCOUNTER — Ambulatory Visit (INDEPENDENT_AMBULATORY_CARE_PROVIDER_SITE_OTHER): Payer: BLUE CROSS/BLUE SHIELD | Admitting: Family Medicine

## 2017-08-11 ENCOUNTER — Encounter: Payer: Self-pay | Admitting: Family Medicine

## 2017-08-11 VITALS — BP 125/82 | HR 74 | Temp 99.1°F | Resp 16 | Ht 65.0 in | Wt 203.6 lb

## 2017-08-11 DIAGNOSIS — N3941 Urge incontinence: Secondary | ICD-10-CM

## 2017-08-11 DIAGNOSIS — Z23 Encounter for immunization: Secondary | ICD-10-CM

## 2017-08-11 DIAGNOSIS — E119 Type 2 diabetes mellitus without complications: Secondary | ICD-10-CM | POA: Diagnosis not present

## 2017-08-11 DIAGNOSIS — Z6833 Body mass index (BMI) 33.0-33.9, adult: Secondary | ICD-10-CM

## 2017-08-11 DIAGNOSIS — R3915 Urgency of urination: Secondary | ICD-10-CM

## 2017-08-11 DIAGNOSIS — Z1231 Encounter for screening mammogram for malignant neoplasm of breast: Secondary | ICD-10-CM

## 2017-08-11 DIAGNOSIS — Z136 Encounter for screening for cardiovascular disorders: Secondary | ICD-10-CM | POA: Diagnosis not present

## 2017-08-11 DIAGNOSIS — Z124 Encounter for screening for malignant neoplasm of cervix: Secondary | ICD-10-CM | POA: Diagnosis not present

## 2017-08-11 DIAGNOSIS — Z0000 Encounter for general adult medical examination without abnormal findings: Secondary | ICD-10-CM

## 2017-08-11 DIAGNOSIS — Z1389 Encounter for screening for other disorder: Secondary | ICD-10-CM | POA: Diagnosis not present

## 2017-08-11 DIAGNOSIS — E039 Hypothyroidism, unspecified: Secondary | ICD-10-CM

## 2017-08-11 DIAGNOSIS — Z130 Encounter for screening for diseases of the blood and blood-forming organs and certain disorders involving the immune mechanism: Secondary | ICD-10-CM | POA: Diagnosis not present

## 2017-08-11 DIAGNOSIS — M25561 Pain in right knee: Secondary | ICD-10-CM

## 2017-08-11 DIAGNOSIS — G8929 Other chronic pain: Secondary | ICD-10-CM

## 2017-08-11 DIAGNOSIS — E6609 Other obesity due to excess calories: Secondary | ICD-10-CM

## 2017-08-11 DIAGNOSIS — Z1212 Encounter for screening for malignant neoplasm of rectum: Secondary | ICD-10-CM

## 2017-08-11 DIAGNOSIS — Z113 Encounter for screening for infections with a predominantly sexual mode of transmission: Secondary | ICD-10-CM | POA: Diagnosis not present

## 2017-08-11 DIAGNOSIS — R945 Abnormal results of liver function studies: Secondary | ICD-10-CM

## 2017-08-11 DIAGNOSIS — Z1211 Encounter for screening for malignant neoplasm of colon: Secondary | ICD-10-CM | POA: Diagnosis not present

## 2017-08-11 DIAGNOSIS — R7989 Other specified abnormal findings of blood chemistry: Secondary | ICD-10-CM

## 2017-08-11 DIAGNOSIS — Z1383 Encounter for screening for respiratory disorder NEC: Secondary | ICD-10-CM | POA: Diagnosis not present

## 2017-08-11 DIAGNOSIS — N7011 Chronic salpingitis: Secondary | ICD-10-CM

## 2017-08-11 DIAGNOSIS — M25562 Pain in left knee: Secondary | ICD-10-CM

## 2017-08-11 HISTORY — DX: Type 2 diabetes mellitus without complications: E11.9

## 2017-08-11 LAB — POC MICROSCOPIC URINALYSIS (UMFC): Mucus: ABSENT

## 2017-08-11 LAB — POCT URINALYSIS DIP (MANUAL ENTRY)
Bilirubin, UA: NEGATIVE
Glucose, UA: NEGATIVE mg/dL
Ketones, POC UA: NEGATIVE mg/dL
Leukocytes, UA: NEGATIVE
Nitrite, UA: NEGATIVE
Spec Grav, UA: 1.025 (ref 1.010–1.025)
Urobilinogen, UA: 0.2 E.U./dL
pH, UA: 6 (ref 5.0–8.0)

## 2017-08-11 LAB — POCT GLYCOSYLATED HEMOGLOBIN (HGB A1C): Hemoglobin A1C: 6.6

## 2017-08-11 NOTE — Progress Notes (Signed)
Subjective:  By signing my name below, I, Courtney Foster, attest that this documentation has been prepared under the direction and in the presence of Courtney Cheadle, MD. Electronically Signed: Moises Foster, [place]. 08/11/2017 , 4:21 PM .  Patient was seen in Room 2 .   Patient ID: Courtney Foster, female    DOB: 05/01/1966, 51 y.o.   MRN: [id number] Chief Complaint  Patient presents with  . Annual Exam   HPI  Courtney Foster is a 51 y.o. female She is fasting today. She was previously followed by [name].   ROS complaint Urinary frequency + urgency: She states she urinates more frequently, and sometimes with urgency, started about 3 months ago. She mentions nocturia 2x if she drinks some water before she goes to sleep. She drinks 1 cup of coffee a day.   Patient's last pelvic exam done by gyn 1 year prior was normal.   Primary Preventative Screenings: Cervical Cancer: Pap 09/23/14 normal with high risk HPV.  Family Planning: declines need STI screening: declines need. Consents to recommended HIV screening today. Breast Cancer: Mammogram 2/29/17 at breast center.  Colorectal Cancer: colonoscopy 10/31/16 by Dr. [name] with [name], 2 rectal polyps, both benign.  Tobacco use/EtOH/substances: none Bone Density: gets weight lifting activity at work Cardiac: none prior but no sxs. Weight/Foster sugar/Diet/Exercise: She is on her feet and walk for about 12 hours for work. She denies drinking diet sodas.  BMI Readings from Last 3 Encounters:  08/11/17 33.88 kg/m  10/31/16 33.28 kg/m  08/23/16 33.41 kg/m   No results found for: HGBA1C OTC/Vit/Supp/Herbal: she takes multivitamin from health department.  Dentist/Optho: Immunizations:  There is no immunization history for the selected administration types on file for this patient.  HIV screening: agrees to it on Foster work today.   Chronic Medical Conditions: Hypothyroidism: takes synthroid, prescribed by [name]; has an  appointment next month.  Bilateral knee pain: takes meloxicam 15mg , prescribed by Dr. [name].   Past Medical History:  Diagnosis Date  . Arthritis   . Thyroid disease   . Type 2 diabetes mellitus without complication, without long-term current use of insulin ([place]) 08/11/2017   Past Surgical History:  Procedure Laterality Date  . CESAREAN SECTION     X2   Current Outpatient Medications on File Prior to Visit  Medication Sig Dispense Refill  . levothyroxine (SYNTHROID, LEVOTHROID) 112 MCG tablet TAKE 1 TABLET BY MOUTH EVERY DAY ON AN EMPTY STOMACH, EXCEPT TAKE 1 AND 1/2 TABLETS ON SATURDAY  6  . meloxicam (MOBIC) 15 MG tablet Take 1 tablet by mouth daily.  2   No current facility-administered medications on file prior to visit.    No Known Allergies History reviewed. No pertinent family history. Social History   Socioeconomic History  . Marital status: Married    Spouse name: None  . Number of children: None  . Years of education: None  . Highest education level: None  Social Needs  . Financial resource strain: None  . Food insecurity - worry: None  . Food insecurity - inability: None  . Transportation needs - medical: None  . Transportation needs - non-medical: None  Occupational History  . None  Tobacco Use  . Smoking status: Never Smoker  . Smokeless tobacco: Never Used  Substance and Sexual Activity  . Alcohol use: No    Alcohol/week: 0.0 oz  . Drug use: Yes    Types: Benzodiazepines  . Sexual activity: Yes  Other Topics Concern  .  None  Social History Narrative  . None    Review of Systems  Constitutional: Positive for fatigue.  Eyes: Positive for visual disturbance (reading).  Gastrointestinal: Positive for constipation.  Genitourinary: Positive for frequency and urgency.  Musculoskeletal: Positive for arthralgias and joint swelling.  All other systems reviewed and are negative.      Objective:   Physical Exam  Constitutional: She is oriented to  person, place, and time. She appears well-developed and well-nourished. No distress.  HENT:  Head: Normocephalic and atraumatic.  Right Ear: Tympanic membrane, external ear and ear canal normal.  Left Ear: Tympanic membrane, external ear and ear canal normal.  Nose: Nose normal. No mucosal edema or rhinorrhea.  Mouth/Throat: Uvula is midline, oropharynx is clear and moist and mucous membranes are normal. No posterior oropharyngeal erythema.  Eyes: Conjunctivae and EOM are normal. Pupils are equal, round, and reactive to light. Right eye exhibits no discharge. Left eye exhibits no discharge. No scleral icterus.  Neck: Normal range of motion. Neck supple. Thyromegaly (mildly enlarged) present.  Cardiovascular: Normal rate, regular rhythm, normal heart sounds and intact distal pulses.  Pulmonary/Chest: Effort normal and breath sounds normal. No respiratory distress.  Normal breast exam  Abdominal: Soft. Bowel sounds are normal. There is no tenderness. There is no CVA tenderness.  Genitourinary: No breast swelling, tenderness, discharge or bleeding.  Musculoskeletal: Normal range of motion. She exhibits no edema.  Lymphadenopathy:    She has no cervical adenopathy.    She has no axillary adenopathy.       Right: No supraclavicular adenopathy present.       Left: No supraclavicular adenopathy present.  Neurological: She is alert and oriented to person, place, and time. She has normal reflexes.  Skin: Skin is warm and dry. She is not diaphoretic. No erythema.  Psychiatric: She has a normal mood and affect. Her behavior is normal.  Nursing note and vitals reviewed.   BP 125/82   Pulse 74   Temp 99.1 F (37.3 C)   Resp 16   Ht 5\' 5"  (1.651 m)   Wt 203 lb 9.6 oz (92.4 kg)   LMP 11/29/2013   SpO2 95%   BMI 33.88 kg/m    Results for orders placed or performed in visit on 08/11/17  POCT urinalysis dipstick  Result Value Ref Range   Color, UA yellow yellow   Clarity, UA clear clear    Glucose, UA negative negative mg/dL   Bilirubin, UA negative negative   Ketones, POC UA negative negative mg/dL   Spec Grav, UA 1.025 1.010 - 1.025   Foster, UA trace-lysed (A) negative   pH, UA 6.0 5.0 - 8.0   Protein Ur, POC trace (A) negative mg/dL   Urobilinogen, UA 0.2 0.2 or 1.0 E.U./dL   Nitrite, UA Negative Negative   Leukocytes, UA Negative Negative  POCT Microscopic Urinalysis ([facility])  Result Value Ref Range   WBC,UR,HPF,POC None None WBC/hpf   RBC,UR,HPF,POC None None RBC/hpf   Bacteria None None, Too numerous to count   Mucus Absent Absent   Epithelial Cells, UR Per Microscopy Moderate (A) None, Too numerous to count cells/hpf  POCT glycosylated hemoglobin (Hb A1C)  Result Value Ref Range   Hemoglobin A1C 6.6        Assessment & Plan:  Ms. Rosenow is a delightful 51 yo woman who presented to the office today for her CPE. She needed to have it done prior to the end of the year. She is a new  patient to our office. She does not anticipate f/u here in the future as she feels adequately cared for by her specialists and plans to resume getting her mandated annual CPE w/ gyn in future as she did in past.  1. Annual physical exam   2. Need for prophylactic vaccination and inoculation against influenza   3. Need for prophylactic vaccination against diphtheria and tetanus   4. Routine screening for STI (sexually transmitted infection)   5. Encounter for screening mammogram for breast cancer   6. Screening for cervical cancer   7. Screening for cardiovascular, respiratory, and genitourinary diseases - rec baseline EKG - pt declines today due to concern for time and cost.  8. Screening for colorectal cancer   9. Screening for deficiency anemia      11. Class 1 obesity due to excess calories without serious comorbidity with body mass index (BMI) of 33.0 to 33.9 in adult   12. Elevated liver function tests - recheck, was resolving last yr. likely fatty liver and meets criteria for  metabolic syndrome with abd obesity, glucose intolerance, low HDL. Had neg viral hep screen last yr. But abd [place] 09/11/2016 nml inc liver parenchyma then were improved on recheck 9 mos prior by GI w/ minimal elev ALT   13. Hypothyroidism, unspecified type - followed annually by endocrinology [name] on levothyroxine 112 qd with 1 1/2 tabs once wkly (today) - will make sure to cc him on today's labs.  14. Urinary urgency   15. Chronic pain of both knees - on meloxicam 15mg  qd from Dr. [name]. S/p cortisone and gel inj which did help some but still feels this chronic pain is her main life limiting factor and does keep her from exercise - encouraged swimming/water walking  16. Urge incontinence of urine - new onset and severe for past sev mos, started suddenly, urinary continence/freq/etc completley normal sev mos prior.  Doubt due to DM since a1c ony 6.6 and no glucose in farlry concentrated urine today. Does not seem to be consuming a lot of foods/meds that are known bladder irritants. UA/micro appear benign but still want to ensure no simmering infxn w/ UClx before starting trial of anti-spasmodic. If labs nml and UClx neg -> rx trial of oxybutynin ER 10mg  qd and asked pt to make appt with [facility] again to address further - will let gyn decide whether she needs to see urology.  Pelvic exam nml last yr per gyn note so deferred today to specialist eval.  17. Type 2 diabetes mellitus without complication, without long-term current use of insulin ([place]) - NEW DIAGNOSIS TODAY WITH HGBA1C 6.6. Reviewed low carb diet, hand-outs given, referred to DM ed. Recheck in 3 mos w/ myself or endocrinologist [name].  18. 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 Sandrea Hughs rec that pt cons undergoing lap BSO   Needs vision screen in office, foot  exam, and ophtho referral at next OV. If a1c still >6.5 -> give pneumovax-23 at next OV. Consider baseline EKG.  Orders Placed This Encounter  Procedures  . Urine Culture  . MM Digital Screening    Standing Status:   Future    Standing Expiration Date:   10/12/2018    Order Specific Question:   Reason for Exam (SYMPTOM  OR DIAGNOSIS REQUIRED)  Answer:   screening    Order Specific Question:   Is the patient pregnant?    Answer:   No    Order Specific Question:   Preferred imaging location?    Answer:   [facility]  . Tdap vaccine greater than or equal to 7yo IM  . Flu Vaccine QUAD 6+ mos PF IM (Fluarix Quad PF)  . Lipid panel    Order Specific Question:   Has the patient fasted?    Answer:   Yes  . Comprehensive metabolic panel    Order Specific Question:   Has the patient fasted?    Answer:   Yes  . CBC with Differential/Platelet  . TSH  . HIV antibody  . Microalbumin/Creatinine Ratio, Urine  . Ambulatory referral to diabetic education    Referral Priority:   Routine    Referral Type:   Consultation    Referral Reason:   Specialty Services Required    Number of Visits Requested:   1  . Ambulatory referral to Gynecology    Referral Priority:   Routine    Referral Type:   Consultation    Referral Reason:   Specialty Services Required    Requested Specialty:   Gynecology    Number of Visits Requested:   1  . POCT urinalysis dipstick  . POCT Microscopic Urinalysis ([facility])  . POCT glycosylated hemoglobin (Hb A1C)     I personally performed the services described in this documentation, which was scribed in my presence. The recorded information has been reviewed and considered, and addended by me as needed.   Courtney Foster, M.D.  Primary Care at [facility] [address] [place], [place] [address] [phone] phone [phone] fax  08/12/17 3:49 AM

## 2017-08-11 NOTE — Patient Instructions (Addendum)
If your urine culture is negative and your other blood tests are normal, we can try you on a medication like vesicare or ditropan to decrease your urinary urgency. I do recommend you follow-up with gynecology for further evaluation of this so I have referred you back to Dr. Dellis Filbert who took over Dr. Sandrea Hughs practice.    IF you received an x-ray today, you will receive an invoice from [facility]. Please contact [facility] at [phone] with questions or concerns regarding your invoice.   IF you received labwork today, you will receive an invoice from [facility]. Please contact [facility] at [phone] with questions or concerns regarding your invoice.   Our billing staff will not be able to assist you with questions regarding bills from these companies.  You will be contacted with the lab results as soon as they are available. The fastest way to get your results is to activate your My Chart account. Instructions are located on the last page of this paperwork. If you have not heard from [place] regarding the results in 2 weeks, please contact this office.       Urinary Incontinence Urinary incontinence is the involuntary loss of urine from your bladder. What are the causes? There are many causes of urinary incontinence. They include:  Medicines.  Infections.  Prostatic enlargement, leading to overflow of urine from your bladder.  Surgery.  Neurological diseases.  Emotional factors.  What are the signs or symptoms? Urinary Incontinence can be divided into four types: 1. Urge incontinence. Urge incontinence is the involuntary loss of urine before you have the opportunity to go to the bathroom. There is a sudden urge to void but not enough time to reach a bathroom. 2. Stress incontinence. Stress incontinence is the sudden loss of urine with any activity that forces urine to pass. It is commonly caused by anatomical changes to the pelvis and sphincter areas of your  body. 3. Overflow incontinence. Overflow incontinence is the loss of urine from an obstructed opening to your bladder. This results in a backup of urine and a resultant buildup of pressure within the bladder. When the pressure within the bladder exceeds the closing pressure of the sphincter, the urine overflows, which causes incontinence, similar to water overflowing a dam. 4. Total incontinence. Total incontinence is the loss of urine as a result of the inability to store urine within your bladder.  How is this diagnosed? Evaluating the cause of incontinence may require:  A thorough and complete medical and obstetric history.  A complete physical exam.  Laboratory tests such as a urine culture and sensitivities.  When additional tests are indicated, they can include:  An ultrasound exam.  Kidney and bladder X-rays.  Cystoscopy. This is an exam of the bladder using a narrow scope.  Urodynamic testing to test the nerve function to the bladder and sphincter areas.  How is this treated? Treatment for urinary incontinence depends on the cause:  For urge incontinence caused by a bacterial infection, antibiotics will be prescribed. If the urge incontinence is related to medicines you take, your health care provider may have you change the medicine.  For stress incontinence, surgery to re-establish anatomical support to the bladder or sphincter, or both, will often correct the condition.  For overflow incontinence caused by an enlarged prostate, an operation to open the channel through the enlarged prostate will allow the flow of urine out of the bladder. In women with fibroids, a hysterectomy may be recommended.  For total incontinence, surgery  on your urinary sphincter may help. An artificial urinary sphincter (an inflatable cuff placed around the urethra) may be required. In women who have developed a hole-like passage between their bladder and vagina (vesicovaginal fistula), surgery to  close the fistula often is required.  Follow these instructions at home:  Normal daily hygiene and the use of pads or adult diapers that are changed regularly will help prevent odors and skin damage.  Avoid caffeine. It can overstimulate your bladder.  Use the bathroom regularly. Try about every 2-3 hours to go to the bathroom, even if you do not feel the need to do so. Take time to empty your bladder completely. After urinating, wait a minute. Then try to urinate again.  For causes involving nerve dysfunction, keep a log of the medicines you take and a journal of the times you go to the bathroom. Contact a health care provider if:  You experience worsening of pain instead of improvement in pain after your procedure.  Your incontinence becomes worse instead of better. Get help right away if:  You experience fever or shaking chills.  You are unable to pass your urine.  You have redness spreading into your groin or down into your thighs. This information is not intended to replace advice given to you by your health care provider. Make sure you discuss any questions you have with your health care provider. Document Released: 09/14/2004 Document Revised: 03/17/2016 Document Reviewed: 01/14/2013 Elsevier Interactive Patient Education  2018 [employer].  Type 2 Diabetes Mellitus, Diagnosis, Adult Type 2 diabetes (type 2 diabetes mellitus) is a long-term (chronic) disease. It may be caused by one or both of these problems:  Your body does not make enough of a hormone called insulin.  Your body does not react in a normal way to insulin that it makes.  Insulin lets sugars (glucose) go into cells in the body. This gives you energy. If you have type 2 diabetes, sugars cannot get into cells. This causes high blood sugar (hyperglycemia). Your doctor will set treatment goals for you. Generally, you should have these blood sugar levels:  Before meals (preprandial): 80-130 mg/dL (4.4-7.2  mmol/L).  After meals (postprandial): below 180 mg/dL (10 mmol/L).  A1c (hemoglobin A1c) level: less than 7%.  Follow these instructions at home: Questions to Ask Your Doctor  You may want to ask these questions:  Do I need to meet with a diabetes educator?  Where can I find a support group for people with diabetes?  What equipment will I need to care for myself at home?  What diabetes medicines do I need? When should I take them?  How often do I need to check my blood sugar?  What number can I call if I have questions?  When is my next doctor's visit?  General instructions  Take over-the-counter and prescription medicines only as told by your doctor.  Keep all follow-up visits as told by your doctor. This is important. Contact a doctor if:  Your blood sugar is at or above 240 mg/dL (13.3 mmol/L) for 2 days in a row.  You have been sick or have had a fever for 2 days or more and you are not getting better.  You have any of these problems for more than 6 hours: ? You cannot eat or drink. ? You feel sick to your stomach (nauseous). ? You throw up (vomit). ? You have watery poop (diarrhea). Get help right away if:  Your blood sugar is lower than  54 mg/dL (3 mmol/L).  You get confused.  You have trouble: ? Thinking clearly. ? Breathing.  You have moderate or large ketone levels in your pee (urine). This information is not intended to replace advice given to you by your health care provider. Make sure you discuss any questions you have with your health care provider. Document Released: 05/16/2008 Document Revised: 01/13/2016 Document Reviewed: 09/10/2015 Elsevier Interactive Patient Education  2018 [employer].  Diabetes Mellitus and Nutrition When you have diabetes (diabetes mellitus), it is very important to have healthy eating habits because your blood sugar (glucose) levels are greatly affected by what you eat and drink. Eating healthy foods in the  appropriate amounts, at about the same times every day, can help you:  Control your blood glucose.  Lower your risk of heart disease.  Improve your blood pressure.  Reach or maintain a healthy weight.  Every person with diabetes is different, and each person has different needs for a meal plan. Your health care provider may recommend that you work with a diet and nutrition specialist (dietitian) to make a meal plan that is best for you. Your meal plan may vary depending on factors such as:  The calories you need.  The medicines you take.  Your weight.  Your blood glucose, blood pressure, and cholesterol levels.  Your activity level.  Other health conditions you have, such as heart or kidney disease.  How do carbohydrates affect me? Carbohydrates affect your blood glucose level more than any other type of food. Eating carbohydrates naturally increases the amount of glucose in your blood. Carbohydrate counting is a method for keeping track of how many carbohydrates you eat. Counting carbohydrates is important to keep your blood glucose at a healthy level, especially if you use insulin or take certain oral diabetes medicines. It is important to know how many carbohydrates you can safely have in each meal. This is different for every person. Your dietitian can help you calculate how many carbohydrates you should have at each meal and for snack. Foods that contain carbohydrates include:  Bread, cereal, rice, pasta, and crackers.  Potatoes and corn.  Peas, beans, and lentils.  Milk and yogurt.  Fruit and juice.  Desserts, such as cakes, cookies, ice cream, and candy.  How does alcohol affect me? Alcohol can cause a sudden decrease in blood glucose (hypoglycemia), especially if you use insulin or take certain oral diabetes medicines. Hypoglycemia can be a life-threatening condition. Symptoms of hypoglycemia (sleepiness, dizziness, and confusion) are similar to symptoms of having  too much alcohol. If your health care provider says that alcohol is safe for you, follow these guidelines:  Limit alcohol intake to no more than 1 drink per day for nonpregnant women and 2 drinks per day for men. One drink equals 12 oz of beer, 5 oz of wine, or 1 oz of hard liquor.  Do not drink on an empty stomach.  Keep yourself hydrated with water, diet soda, or unsweetened iced tea.  Keep in mind that regular soda, juice, and other mixers may contain a lot of sugar and must be counted as carbohydrates.  What are tips for following this plan? Reading food labels  Start by checking the serving size on the label. The amount of calories, carbohydrates, fats, and other nutrients listed on the label are based on one serving of the food. Many foods contain more than one serving per package.  Check the total grams (g) of carbohydrates in one serving. You can  calculate the number of servings of carbohydrates in one serving by dividing the total carbohydrates by 15. For example, if a food has 30 g of total carbohydrates, it would be equal to 2 servings of carbohydrates.  Check the number of grams (g) of saturated and trans fats in one serving. Choose foods that have low or no amount of these fats.  Check the number of milligrams (mg) of sodium in one serving. Most people should limit total sodium intake to less than 2,300 mg per day.  Always check the nutrition information of foods labeled as "low-fat" or "nonfat". These foods may be higher in added sugar or refined carbohydrates and should be avoided.  Talk to your dietitian to identify your daily goals for nutrients listed on the label. Shopping  Avoid buying canned, premade, or processed foods. These foods tend to be high in fat, sodium, and added sugar.  Shop around the outside edge of the grocery store. This includes fresh fruits and vegetables, bulk grains, fresh meats, and fresh dairy. Cooking  Use low-heat cooking methods, such as  baking, instead of high-heat cooking methods like deep frying.  Cook using healthy oils, such as olive, canola, or sunflower oil.  Avoid cooking with butter, cream, or high-fat meats. Meal planning  Eat meals and snacks regularly, preferably at the same times every day. Avoid going long periods of time without eating.  Eat foods high in fiber, such as fresh fruits, vegetables, beans, and whole grains. Talk to your dietitian about how many servings of carbohydrates you can eat at each meal.  Eat 4-6 ounces of lean protein each day, such as lean meat, chicken, fish, eggs, or tofu. 1 ounce is equal to 1 ounce of meat, chicken, or fish, 1 egg, or 1/4 cup of tofu.  Eat some foods each day that contain healthy fats, such as avocado, nuts, seeds, and fish. Lifestyle   Check your blood glucose regularly.  Exercise at least 30 minutes 5 or more days each week, or as told by your health care provider.  Take medicines as told by your health care provider.  Do not use any products that contain nicotine or tobacco, such as cigarettes and e-cigarettes. If you need help quitting, ask your health care provider.  Work with a Social worker or diabetes educator to identify strategies to manage stress and any emotional and social challenges. What are some questions to ask my health care provider?  Do I need to meet with a diabetes educator?  Do I need to meet with a dietitian?  What number can I call if I have questions?  When are the best times to check my blood glucose? Where to find more information:  American Diabetes Association: diabetes.org/food-and-fitness/food  Academy of Nutrition and Dietetics: PokerClues.dk  [employer] of Diabetes and Digestive and Kidney Diseases (NIH): ContactWire.be Summary  A healthy meal plan will help you control your blood glucose  and maintain a healthy lifestyle.  Working with a diet and nutrition specialist (dietitian) can help you make a meal plan that is best for you.  Keep in mind that carbohydrates and alcohol have immediate effects on your blood glucose levels. It is important to count carbohydrates and to use alcohol carefully. This information is not intended to replace advice given to you by your health care provider. Make sure you discuss any questions you have with your health care provider. Document Released: 05/04/2005 Document Revised: 09/11/2016 Document Reviewed: 09/11/2016 Elsevier Interactive Patient Education  [employer].  Tips for Eating Away From Home If You Have Diabetes Controlling your level of blood glucose, also known as blood sugar, can be challenging. It can be even more difficult when you do not prepare your own meals. The following tips can help you manage your diabetes when you eat away from home. Planning ahead Plan ahead if you know you will be eating away from home:  Ask your health care provider how to time meals and medicine if you are taking insulin.  Make a list of restaurants near you that offer healthy choices. If they have a carry-out menu, take it home and plan what you will order ahead of time.  Look up the restaurant you want to eat at online. Many chain and fast-food restaurants list nutritional information online. Use this information to choose the healthiest options and to calculate how many carbohydrates will be in your meal.  Use a carbohydrate-counting book or mobile app to look up the carbohydrate content and serving size of the foods you want to eat.  Become familiar with serving sizes and learn to recognize how many servings are in a portion. This will allow you to estimate how many carbohydrates you can eat.  Free foods A "free food" is any food or drink that has less than 5 g of carbohydrates per serving. Free foods include:  Many vegetables.  Hard  boiled eggs.  Nuts or seeds.  Olives.  Cheeses.  Meats.  These types of foods make good appetizer choices and are often available at salad bars. Lemon juice, vinegar, or a low-calorie salad dressing of fewer than 20 calories per serving can be used as a "free" salad dressing. Choices to reduce carbohydrates  Substitute nonfat sweetened yogurt with a sugar-free yogurt. Yogurt made from soy milk may also be used, but you will still want a sugar-free or plain option to choose a lower carbohydrate amount.  Ask your server to take away the bread basket or chips from your table.  Order fresh fruit. A salad bar often offers fresh fruit choices. Avoid canned fruit because it is usually packed in sugar or syrup.  Order a salad, and eat it without dressing. Or, create a "free" salad dressing.  Ask for substitutions. For example, instead of [place] fries, request an order of a vegetable such as salad, green beans, or broccoli. Other tips  If you take insulin, take the insulin once your food arrives to your table. This will ensure your insulin and food are timed correctly.  Ask your server about the portion size before your order, and ask for a take-out box if the portion has more servings than you should have. When your food comes, leave the amount you should have on the plate, and put the rest in the take-out box.  Consider splitting an entree with someone and ordering a side salad. This information is not intended to replace advice given to you by your health care provider. Make sure you discuss any questions you have with your health care provider. Document Released: 08/07/2005 Document Revised: 01/13/2016 Document Reviewed: 11/04/2013 Elsevier Interactive Patient Education  [employer].

## 2017-08-12 ENCOUNTER — Encounter: Payer: Self-pay | Admitting: Family Medicine

## 2017-08-12 DIAGNOSIS — M25561 Pain in right knee: Secondary | ICD-10-CM

## 2017-08-12 DIAGNOSIS — G8929 Other chronic pain: Secondary | ICD-10-CM | POA: Insufficient documentation

## 2017-08-12 DIAGNOSIS — M25562 Pain in left knee: Secondary | ICD-10-CM

## 2017-08-14 LAB — COMPREHENSIVE METABOLIC PANEL
ALT: 79 IU/L — ABNORMAL HIGH (ref 0–32)
AST: 50 IU/L — ABNORMAL HIGH (ref 0–40)
Albumin/Globulin Ratio: 1.8 (ref 1.2–2.2)
Albumin: 4.6 g/dL (ref 3.5–5.5)
Alkaline Phosphatase: 98 IU/L (ref 39–117)
BUN/Creatinine Ratio: 21 (ref 9–23)
BUN: 17 mg/dL (ref 6–24)
Bilirubin Total: 0.3 mg/dL (ref 0.0–1.2)
CO2: 24 mmol/L (ref 20–29)
Calcium: 9.1 mg/dL (ref 8.7–10.2)
Chloride: 103 mmol/L (ref 96–106)
Creatinine, Ser: 0.81 mg/dL (ref 0.57–1.00)
GFR calc Af Amer: 96 mL/min/{1.73_m2} (ref >59)
GFR calc non Af Amer: 83 mL/min/{1.73_m2} (ref >59)
Globulin, Total: 2.6 g/dL (ref 1.5–4.5)
Glucose: 98 mg/dL (ref 65–99)
Potassium: 4 mmol/L (ref 3.5–5.2)
Sodium: 142 mmol/L (ref 134–144)
Total Protein: 7.2 g/dL (ref 6.0–8.5)

## 2017-08-14 LAB — CBC WITH DIFFERENTIAL/PLATELET
Basophils Absolute: 0 10*3/uL (ref 0.0–0.2)
Basos: 1 %
EOS (ABSOLUTE): 0.2 10*3/uL (ref 0.0–0.4)
Eos: 3 %
Hematocrit: 39.8 % (ref 34.0–46.6)
Hemoglobin: 13.5 g/dL (ref 11.1–15.9)
Immature Grans (Abs): 0 10*3/uL (ref 0.0–0.1)
Immature Granulocytes: 0 %
Lymphocytes Absolute: 3.1 10*3/uL (ref 0.7–3.1)
Lymphs: 47 %
MCH: 29 pg (ref 26.6–33.0)
MCHC: 33.9 g/dL (ref 31.5–35.7)
MCV: 86 fL (ref 79–97)
Monocytes Absolute: 0.4 10*3/uL (ref 0.1–0.9)
Monocytes: 7 %
Neutrophils Absolute: 2.7 10*3/uL (ref 1.4–7.0)
Neutrophils: 42 %
Platelets: 296 10*3/uL (ref 150–379)
RBC: 4.65 x10E6/uL (ref 3.77–5.28)
RDW: 16.2 % — ABNORMAL HIGH (ref 12.3–15.4)
WBC: 6.5 10*3/uL (ref 3.4–10.8)

## 2017-08-14 LAB — LIPID PANEL
Chol/HDL Ratio: 4.2 ratio (ref 0.0–4.4)
Cholesterol, Total: 168 mg/dL (ref 100–199)
HDL: 40 mg/dL (ref >39)
LDL Calculated: 105 mg/dL — ABNORMAL HIGH (ref 0–99)
Triglycerides: 117 mg/dL (ref 0–149)
VLDL Cholesterol Cal: 23 mg/dL (ref 5–40)

## 2017-08-14 LAB — TSH: TSH: 4.45 u[IU]/mL (ref 0.450–4.500)

## 2017-08-14 LAB — MICROALBUMIN / CREATININE URINE RATIO
Creatinine, Urine: 235.9 mg/dL
Microalb/Creat Ratio: 10.6 mg/g creat (ref 0.0–30.0)
Microalbumin, Urine: 24.9 ug/mL

## 2017-08-14 LAB — HIV ANTIBODY (ROUTINE TESTING W REFLEX): HIV Screen 4th Generation wRfx: NONREACTIVE

## 2017-08-14 LAB — URINE CULTURE

## 2017-08-19 MED ORDER — OXYBUTYNIN CHLORIDE ER 10 MG PO TB24: 10.0000 mg | ORAL_TABLET | Freq: Every day | ORAL | 0 refills | Status: DC

## 2017-08-19 NOTE — Addendum Note (Signed)
Addended by: Shawnee Knapp on: 08/19/2017 07:10 AM   Modules accepted: Orders

## 2017-09-19 ENCOUNTER — Encounter: Payer: BLUE CROSS/BLUE SHIELD | Admitting: Obstetrics & Gynecology

## 2017-10-08 DIAGNOSIS — E063 Autoimmune thyroiditis: Secondary | ICD-10-CM | POA: Diagnosis not present

## 2017-10-08 DIAGNOSIS — E039 Hypothyroidism, unspecified: Secondary | ICD-10-CM | POA: Diagnosis not present

## 2017-10-08 DIAGNOSIS — G4733 Obstructive sleep apnea (adult) (pediatric): Secondary | ICD-10-CM | POA: Diagnosis not present

## 2017-10-08 DIAGNOSIS — K5900 Constipation, unspecified: Secondary | ICD-10-CM | POA: Diagnosis not present

## 2017-10-23 ENCOUNTER — Ambulatory Visit
Admission: RE | Admit: 2017-10-23 | Discharge: 2017-10-23 | Disposition: A | Discharge status: Home / Self Care | Payer: BLUE CROSS/BLUE SHIELD | Source: Ambulatory Visit | Attending: Family Medicine | Admitting: Family Medicine

## 2017-10-23 DIAGNOSIS — Z1231 Encounter for screening mammogram for malignant neoplasm of breast: Secondary | ICD-10-CM

## 2017-10-26 ENCOUNTER — Ambulatory Visit (INDEPENDENT_AMBULATORY_CARE_PROVIDER_SITE_OTHER): Payer: BLUE CROSS/BLUE SHIELD | Admitting: Obstetrics & Gynecology

## 2017-10-26 ENCOUNTER — Encounter: Payer: Self-pay | Admitting: Obstetrics & Gynecology

## 2017-10-26 VITALS — BP 124/80 | Ht 65.0 in | Wt 200.0 lb

## 2017-10-26 DIAGNOSIS — R7302 Impaired glucose tolerance (oral): Secondary | ICD-10-CM | POA: Diagnosis not present

## 2017-10-26 DIAGNOSIS — E6609 Other obesity due to excess calories: Secondary | ICD-10-CM

## 2017-10-26 DIAGNOSIS — Z01419 Encounter for gynecological examination (general) (routine) without abnormal findings: Secondary | ICD-10-CM | POA: Diagnosis not present

## 2017-10-26 DIAGNOSIS — N911 Secondary amenorrhea: Secondary | ICD-10-CM | POA: Diagnosis not present

## 2017-10-26 DIAGNOSIS — Z6833 Body mass index (BMI) 33.0-33.9, adult: Secondary | ICD-10-CM | POA: Diagnosis not present

## 2017-10-26 DIAGNOSIS — N949 Unspecified condition associated with female genital organs and menstrual cycle: Secondary | ICD-10-CM | POA: Diagnosis not present

## 2017-10-26 NOTE — Progress Notes (Signed)
Courtney Foster September 25, 1965 [id number]   History:    52 y.o. G4P2A2L2 Married  RP:  Established patient presenting for annual gyn exam   HPI: Last menstrual.  April 2015, but [facility] was low at 6.5 in 2017.  Therefore, body mass index 33.28.  History of amenorrhea probably associated with PCOS.  Occasional hot flushes.  No pelvic pain.  Abstinent currently.  Urine and bowel movements normal.  Breasts normal.  Mild elevation of AST ALT followed by family physician.  New diagnosis of glucose intolerance.  Not on any medications currently.  Hypothyroidism followed by Dr Kalman Shan.  Past medical history,surgical history, family history and social history were all reviewed and documented in the EPIC chart.  Gynecologic History Patient's last menstrual period was 11/29/2013. Contraception: post menopausal status Last Pap: 09/2014. Results were: Negative Last mammogram: 10/2017. Results were: Negative Bone Density: Never Colonoscopy: 10/2016 benign polyps.  Obstetric History OB History  Gravida Para Term Preterm AB Living  4 2     2 2   SAB TAB Ectopic Multiple Live Births  2   0        # Outcome Date GA Lbr Len/2nd Weight Sex Delivery Anes PTL Lv  4 SAB           3 SAB           2 Para           1 Para                ROS: A ROS was performed and pertinent positives and negatives are included in the history.  GENERAL: No fevers or chills. HEENT: No change in vision, no earache, sore throat or sinus congestion. NECK: No pain or stiffness. CARDIOVASCULAR: No chest pain or pressure. No palpitations. PULMONARY: No shortness of breath, cough or wheeze. GASTROINTESTINAL: No abdominal pain, nausea, vomiting or diarrhea, melena or bright red blood per rectum. GENITOURINARY: No urinary frequency, urgency, hesitancy or dysuria. MUSCULOSKELETAL: No joint or muscle pain, no back pain, no recent trauma. DERMATOLOGIC: No rash, no itching, no lesions. ENDOCRINE: No polyuria, polydipsia, no heat or cold intolerance.  No recent change in weight. HEMATOLOGICAL: No anemia or easy bruising or bleeding. NEUROLOGIC: No headache, seizures, numbness, tingling or weakness. PSYCHIATRIC: No depression, no loss of interest in normal activity or change in sleep pattern.     Exam:   BP 124/80 (BP Location: Right Arm, Patient Position: Sitting, Cuff Size: Large)   Ht 5\' 5"  (1.651 m)   Wt 200 lb (90.7 kg)   LMP 11/29/2013   BMI 33.28 kg/m   Body mass index is 33.28 kg/m.  General appearance : Well developed well nourished female. No acute distress HEENT: Eyes: no retinal hemorrhage or exudates,  Neck supple, trachea midline, no carotid bruits, no thyroidmegaly Lungs: Clear to auscultation, no rhonchi or wheezes, or rib retractions  Heart: Regular rate and rhythm, no murmurs or gallops Breast:Examined in sitting and supine position were symmetrical in appearance, no palpable masses or tenderness,  no skin retraction, no nipple inversion, no nipple discharge, no skin discoloration, no axillary or supraclavicular lymphadenopathy Abdomen: no palpable masses or tenderness, no rebound or guarding Extremities: no edema or skin discoloration or tenderness  Pelvic: Vulva: Normal             Vagina: No gross lesions or discharge  Cervix: No gross lesions or discharge.  Pap/HR HPV done  Uterus  AV, normal size, shape and consistency, non-tender and mobile  Adnexa  Without masses or tenderness  Anus: Normal  Pelvic US on 07/2016:  Ultrasound uterus measured 11.5 x 7.5 x 6.3 cm with endometrial stripe of 13.1 mm. Endometrial fluid-filled was noted and nabothian cyst 27 x 20 mm was noted. Her right ovary echo-free cyst was noted 3 of them one measured 23 x 21 mm, the second one 13 x 13 mm, the third one 27 x 20 mm. A left ovarian cyst 23 x 18 mm was noted along with a cystic tubular mass forming upon itself measures 6.8 x 3.2 x 6.7 cm consistent with hydrosalpinx. There was negative color flow. No fluid in the  cul-de-sac.  Assessment/Plan:  52 y.o. female for annual exam   1. Encounter for routine gynecological examination with Papanicolaou smear of cervix Normal gynecologic exam.  Pap with high risk HPV done today.  Breast exam normal.  Screening mammogram negative in March 2019.  Colonoscopy March 2018 with 2 benign polyps removed.  Health labs with family physician.  2. Amenorrhea, secondary History of PCOS, but probably entering menopause now.  Will do [place] today.  Will follow-up if in menopause and desires hormone replacement therapy. - FSH  3. Glucose intolerance (impaired glucose tolerance) Low glucose diet, regular physical activity and weight loss reviewed with patient.  Will verify her hemoglobin A1c today and patient will follow up with her family physician. - HgB A1c  4. Adnexal cyst Per pelvic ultrasound in December 2017, patient has a left adnexal cyst compatible with a hydrosalpinx.  We will follow-up by pelvic ultrasound to reevaluate. - US Transvaginal Non-OB; Future  5. Class 1 obesity due to excess calories without serious comorbidity with body mass index (BMI) of 33.0 to 33.9 in adult Low calorie/carb diet recommended with regular aerobic physical activity 5 times a week and weightlifting every 2 days.  Counseling on above issues more than 50% for 10 minutes.  Princess Bruins MD, 11:05 AM 10/26/2017

## 2017-10-27 LAB — HEMOGLOBIN A1C
Hgb A1c MFr Bld: 6.4 % of total Hgb — ABNORMAL HIGH (ref <5.7)
Mean Plasma Glucose: 137 (calc)
eAG (mmol/L): 7.6 (calc)

## 2017-10-27 LAB — FOLLICLE STIMULATING HORMONE: FSH: 32.4 m[IU]/mL

## 2017-10-28 ENCOUNTER — Encounter: Payer: Self-pay | Admitting: Obstetrics & Gynecology

## 2017-10-28 NOTE — Patient Instructions (Signed)
1. Encounter for routine gynecological examination with Papanicolaou smear of cervix Normal gynecologic exam.  Pap with high risk HPV done today.  Breast exam normal.  Screening mammogram negative in March 2019.  Colonoscopy March 2018 with 2 benign polyps removed.  Health labs with family physician.  2. Amenorrhea, secondary History of PCOS, but probably entering menopause now.  Will do Joaquin today.  Will follow-up if in menopause and desires hormone replacement therapy. - FSH  3. Glucose intolerance (impaired glucose tolerance) Low glucose diet, regular physical activity and weight loss reviewed with patient.  Will verify her hemoglobin A1c today and patient will follow up with her family physician. - HgB A1c  4. Adnexal cyst Per pelvic ultrasound in December 2017, patient has a left adnexal cyst compatible with a hydrosalpinx.  We will follow-up by pelvic ultrasound to reevaluate. - US Transvaginal Non-OB; Future  5. Class 1 obesity due to excess calories without serious comorbidity with body mass index (BMI) of 33.0 to 33.9 in adult Low calorie/carb diet recommended with regular aerobic physical activity 5 times a week and weightlifting every 2 days.  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  Voy a informarle de sus [employer]!     Health Maintenance for Postmenopausal Women Menopause is a normal process in which your reproductive ability comes to an end. This process happens gradually over a span of months to years, usually between the ages of 57 and 29. Menopause is complete when you have missed 12 consecutive menstrual periods. It is important to talk with your health care provider about some of the most common conditions that affect postmenopausal women, such as heart disease, cancer, and bone loss (osteoporosis). Adopting a healthy lifestyle and getting preventive care can help to promote your health and wellness. Those actions can also lower your chances of developing some of  these common conditions. What should I know about menopause? During menopause, you may experience a number of symptoms, such as:  Moderate-to-severe hot flashes.  Night sweats.  Decrease in sex drive.  Mood swings.  Headaches.  Tiredness.  Irritability.  Memory problems.  Insomnia.  Choosing to treat or not to treat menopausal changes is an individual decision that you make with your health care provider. What should I know about hormone replacement therapy and supplements? Hormone therapy products are effective for treating symptoms that are associated with menopause, such as hot flashes and night sweats. Hormone replacement carries certain risks, especially as you become older. If you are thinking about using estrogen or estrogen with progestin treatments, discuss the benefits and risks with your health care provider. What should I know about heart disease and stroke? Heart disease, heart attack, and stroke become more likely as you age. This may be due, in part, to the hormonal changes that your body experiences during menopause. These can affect how your body processes dietary fats, triglycerides, and cholesterol. Heart attack and stroke are both medical emergencies. There are many things that you can do to help prevent heart disease and stroke:  Have your blood pressure checked at least every 1-2 years. High blood pressure causes heart disease and increases the risk of stroke.  If you are 35-42 years old, ask your health care provider if you should take aspirin to prevent a heart attack or a stroke.  Do not use any tobacco products, including cigarettes, chewing tobacco, or electronic cigarettes. If you need help quitting, ask your health care provider.  It is important to eat a healthy  diet and maintain a healthy weight. ? Be sure to include plenty of vegetables, fruits, low-fat dairy products, and lean protein. ? Avoid eating foods that are high in solid fats, added  sugars, or salt (sodium).  Get regular exercise. This is one of the most important things that you can do for your health. ? Try to exercise for at least 150 minutes each week. The type of exercise that you do should increase your heart rate and make you sweat. This is known as moderate-intensity exercise. ? Try to do strengthening exercises at least twice each week. Do these in addition to the moderate-intensity exercise.  Know your numbers.Ask your health care provider to check your cholesterol and your blood glucose. Continue to have your blood tested as directed by your health care provider.  What should I know about cancer screening? There are several types of cancer. Take the following steps to reduce your risk and to catch any cancer development as early as possible. Breast Cancer  Practice breast self-awareness. ? This means understanding how your breasts normally appear and feel. ? It also means doing regular breast self-exams. Let your health care provider know about any changes, no matter how small.  If you are 39 or older, have a clinician do a breast exam (clinical breast exam or CBE) every year. Depending on your age, family history, and medical history, it may be recommended that you also have a yearly breast X-ray (mammogram).  If you have a family history of breast cancer, talk with your health care provider about genetic screening.  If you are at high risk for breast cancer, talk with your health care provider about having an MRI and a mammogram every year.  Breast cancer (BRCA) gene test is recommended for women who have family members with BRCA-related cancers. Results of the assessment will determine the need for genetic counseling and BRCA1 and for BRCA2 testing. BRCA-related cancers include these types: ? Breast. This occurs in males or females. ? Ovarian. ? Tubal. This may also be called fallopian tube cancer. ? Cancer of the abdominal or pelvic lining (peritoneal  cancer). ? Prostate. ? Pancreatic.  Cervical, Uterine, and Ovarian Cancer Your health care provider may recommend that you be screened regularly for cancer of the pelvic organs. These include your ovaries, uterus, and vagina. This screening involves a pelvic exam, which includes checking for microscopic changes to the surface of your cervix (Pap test).  For women ages 21-65, health care providers may recommend a pelvic exam and a Pap test every three years. For women ages 30-65, they may recommend the Pap test and pelvic exam, combined with testing for human papilloma virus (HPV), every five years. Some types of HPV increase your risk of cervical cancer. Testing for HPV may also be done on women of any age who have unclear Pap test results.  Other health care providers may not recommend any screening for nonpregnant women who are considered low risk for pelvic cancer and have no symptoms. Ask your health care provider if a screening pelvic exam is right for you.  If you have had past treatment for cervical cancer or a condition that could lead to cancer, you need Pap tests and screening for cancer for at least 20 years after your treatment. If Pap tests have been discontinued for you, your risk factors (such as having a new sexual partner) need to be reassessed to determine if you should start having screenings again. Some women have medical problems that  increase the chance of getting cervical cancer. In these cases, your health care provider may recommend that you have screening and Pap tests more often.  If you have a family history of uterine cancer or ovarian cancer, talk with your health care provider about genetic screening.  If you have vaginal bleeding after reaching menopause, tell your health care provider.  There are currently no reliable tests available to screen for ovarian cancer.  Lung Cancer Lung cancer screening is recommended for adults 47-21 years old who are at high risk for  lung cancer because of a history of smoking. A yearly low-dose CT scan of the lungs is recommended if you:  Currently smoke.  Have a history of at least 30 pack-years of smoking and you currently smoke or have quit within the past 15 years. A pack-year is smoking an average of one pack of cigarettes per day for one year.  Yearly screening should:  Continue until it has been 15 years since you quit.  Stop if you develop a health problem that would prevent you from having lung cancer treatment.  Colorectal Cancer  This type of cancer can be detected and can often be prevented.  Routine colorectal cancer screening usually begins at age 25 and continues through age 39.  If you have risk factors for colon cancer, your health care provider may recommend that you be screened at an earlier age.  If you have a family history of colorectal cancer, talk with your health care provider about genetic screening.  Your health care provider may also recommend using home test kits to check for hidden blood in your stool.  A small camera at the end of a tube can be used to examine your colon directly (sigmoidoscopy or colonoscopy). This is done to check for the earliest forms of colorectal cancer.  Direct examination of the colon should be repeated every 5-10 years until age 31. However, if early forms of precancerous polyps or small growths are found or if you have a family history or genetic risk for colorectal cancer, you may need to be screened more often.  Skin Cancer  Check your skin from head to toe regularly.  Monitor any moles. Be sure to tell your health care provider: ? About any new moles or changes in moles, especially if there is a change in a mole's shape or color. ? If you have a mole that is larger than the size of a pencil eraser.  If any of your family members has a history of skin cancer, especially at a young age, talk with your health care provider about genetic  screening.  Always use sunscreen. Apply sunscreen liberally and repeatedly throughout the day.  Whenever you are outside, protect yourself by wearing long sleeves, pants, a wide-brimmed hat, and sunglasses.  What should I know about osteoporosis? Osteoporosis is a condition in which bone destruction happens more quickly than new bone creation. After menopause, you may be at an increased risk for osteoporosis. To help prevent osteoporosis or the bone fractures that can happen because of osteoporosis, the following is recommended:  If you are 12-47 years old, get at least 1,000 mg of calcium and at least 600 mg of vitamin D per day.  If you are older than age 69 but younger than age 36, get at least 1,200 mg of calcium and at least 600 mg of vitamin D per day.  If you are older than age 7, get at least 1,200 mg of calcium  and at least 800 mg of vitamin D per day.  Smoking and excessive alcohol intake increase the risk of osteoporosis. Eat foods that are rich in calcium and vitamin D, and do weight-bearing exercises several times each week as directed by your health care provider. What should I know about how menopause affects my mental health? Depression may occur at any age, but it is more common as you become older. Common symptoms of depression include:  Low or sad mood.  Changes in sleep patterns.  Changes in appetite or eating patterns.  Feeling an overall lack of motivation or enjoyment of activities that you previously enjoyed.  Frequent crying spells.  Talk with your health care provider if you think that you are experiencing depression. What should I know about immunizations? It is important that you get and maintain your immunizations. These include:  Tetanus, diphtheria, and pertussis (Tdap) booster vaccine.  Influenza every year before the flu season begins.  Pneumonia vaccine.  Shingles vaccine.  Your health care provider may also recommend other  immunizations. This information is not intended to replace advice given to you by your health care provider. Make sure you discuss any questions you have with your health care provider. Document Released: 09/29/2005 Document Revised: 02/25/2016 Document Reviewed: 05/11/2015 Elsevier Interactive Patient Education  2018 [employer].

## 2017-10-31 LAB — PAP, TP IMAGING W/ HPV RNA, RFLX HPV TYPE 16,18/45: HPV DNA High Risk: NOT DETECTED

## 2017-11-26 DIAGNOSIS — E039 Hypothyroidism, unspecified: Secondary | ICD-10-CM | POA: Diagnosis not present

## 2017-11-30 ENCOUNTER — Emergency Department (HOSPITAL_COMMUNITY): Payer: BLUE CROSS/BLUE SHIELD

## 2017-11-30 ENCOUNTER — Encounter (HOSPITAL_COMMUNITY): Payer: Self-pay | Admitting: Emergency Medicine

## 2017-11-30 ENCOUNTER — Emergency Department (HOSPITAL_COMMUNITY)
Admission: EM | Admit: 2017-11-30 | Discharge: 2017-11-30 | Disposition: A | Discharge status: Home / Self Care | Payer: BLUE CROSS/BLUE SHIELD | Attending: Emergency Medicine | Admitting: Emergency Medicine

## 2017-11-30 DIAGNOSIS — Y939 Activity, unspecified: Secondary | ICD-10-CM | POA: Diagnosis not present

## 2017-11-30 DIAGNOSIS — S3992XA Unspecified injury of lower back, initial encounter: Secondary | ICD-10-CM | POA: Diagnosis not present

## 2017-11-30 DIAGNOSIS — S39012A Strain of muscle, fascia and tendon of lower back, initial encounter: Secondary | ICD-10-CM | POA: Diagnosis not present

## 2017-11-30 DIAGNOSIS — Y929 Unspecified place or not applicable: Secondary | ICD-10-CM | POA: Insufficient documentation

## 2017-11-30 DIAGNOSIS — Y999 Unspecified external cause status: Secondary | ICD-10-CM | POA: Diagnosis not present

## 2017-11-30 DIAGNOSIS — M545 Low back pain: Secondary | ICD-10-CM | POA: Diagnosis not present

## 2017-11-30 DIAGNOSIS — Z79899 Other long term (current) drug therapy: Secondary | ICD-10-CM | POA: Insufficient documentation

## 2017-11-30 DIAGNOSIS — Z041 Encounter for examination and observation following transport accident: Secondary | ICD-10-CM | POA: Diagnosis not present

## 2017-11-30 MED ORDER — IBUPROFEN 800 MG PO TABS: 800.0000 mg | ORAL_TABLET | Freq: Three times a day (TID) | ORAL | 0 refills | Status: DC | PRN
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
## 2017-12-04 ENCOUNTER — Emergency Department (HOSPITAL_COMMUNITY)
Admission: EM | Admit: 2017-12-04 | Discharge: 2017-12-04 | Disposition: A | Discharge status: Home / Self Care | Payer: BLUE CROSS/BLUE SHIELD | Attending: Emergency Medicine | Admitting: Emergency Medicine

## 2017-12-04 ENCOUNTER — Other Ambulatory Visit: Payer: Self-pay

## 2017-12-04 ENCOUNTER — Emergency Department (HOSPITAL_COMMUNITY): Payer: BLUE CROSS/BLUE SHIELD

## 2017-12-04 ENCOUNTER — Encounter (HOSPITAL_COMMUNITY): Payer: Self-pay | Admitting: Emergency Medicine

## 2017-12-04 DIAGNOSIS — Z79899 Other long term (current) drug therapy: Secondary | ICD-10-CM | POA: Insufficient documentation

## 2017-12-04 DIAGNOSIS — M25511 Pain in right shoulder: Secondary | ICD-10-CM | POA: Diagnosis not present

## 2017-12-04 DIAGNOSIS — S4991XA Unspecified injury of right shoulder and upper arm, initial encounter: Secondary | ICD-10-CM | POA: Diagnosis not present

## 2017-12-04 DIAGNOSIS — E039 Hypothyroidism, unspecified: Secondary | ICD-10-CM | POA: Diagnosis not present

## 2017-12-04 DIAGNOSIS — M542 Cervicalgia: Secondary | ICD-10-CM | POA: Diagnosis not present

## 2017-12-04 DIAGNOSIS — M19011 Primary osteoarthritis, right shoulder: Secondary | ICD-10-CM | POA: Diagnosis not present

## 2017-12-04 DIAGNOSIS — S199XXA Unspecified injury of neck, initial encounter: Secondary | ICD-10-CM | POA: Diagnosis not present

## 2017-12-04 DIAGNOSIS — E119 Type 2 diabetes mellitus without complications: Secondary | ICD-10-CM | POA: Diagnosis not present
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
## 2017-12-04 NOTE — ED Notes (Signed)
Pt states that she was wanting to be seen for follow up for continued pain in right shoulder and left hip.
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
## 2018-01-21 ENCOUNTER — Other Ambulatory Visit: Payer: Self-pay

## 2018-01-21 ENCOUNTER — Encounter: Payer: Self-pay | Admitting: Urgent Care

## 2018-01-21 ENCOUNTER — Ambulatory Visit (INDEPENDENT_AMBULATORY_CARE_PROVIDER_SITE_OTHER): Payer: BLUE CROSS/BLUE SHIELD | Admitting: Urgent Care

## 2018-01-21 VITALS — BP 144/84 | HR 76 | Temp 98.3°F | Resp 18 | Ht 65.0 in | Wt 198.8 lb

## 2018-01-21 DIAGNOSIS — L299 Pruritus, unspecified: Secondary | ICD-10-CM

## 2018-01-21 DIAGNOSIS — L918 Other hypertrophic disorders of the skin: Secondary | ICD-10-CM | POA: Diagnosis not present

## 2018-01-21 DIAGNOSIS — L989 Disorder of the skin and subcutaneous tissue, unspecified: Secondary | ICD-10-CM

## 2018-01-21 NOTE — Patient Instructions (Addendum)
Papiloma cutneo en los adultos Skin Tag, Adult Un papiloma cutneo (acrocordn) es un crecimiento extra de piel suave. La mayora de los papilomas cutneos tienen el mismo color de la piel y no son ms grandes que la goma de un lpiz. Por lo general, se forman en zonas donde hay pliegues de la piel, como la axila o la ingle. Los papilomas cutneos no son peligrosos y no se transmiten de Ardelia Mems persona a Alcus Dad (no son contagiosos). Puede tener un solo papiloma cutneo o varios. Los papilomas cutneos no requieren Clinical research associate. Sin embargo, el mdico puede recomendarle que se lo quite si:  Se irrita con el roce de la ropa.  Sangra.  Se ve y tiene un aspecto antiesttico.  El mdico puede quitar el papiloma cutneo mediante un procedimiento quirrgico simple o mediante un procedimiento que implica el congelamiento del papiloma. Siga estas indicaciones en su casa:  Controle el papiloma para detectar cualquier cambio. Un papiloma cutneo normal no requiere ningn otro cuidado especial en el hogar.  Tome los medicamentos de venta libre y los recetados solamente como se lo haya indicado el mdico.  Consulting civil engineer a todas las visitas de control como se lo haya indicado el mdico. Esto es importante. Comunquese con un mdico si:  Tiene un papiloma cutneo que: ? Duele. ? Cambia de color. ? Sangra. ? Se hincha.  Le aparecen ms papilomas cutneos. Esta informacin no tiene Marine scientist el consejo del mdico. Asegrese de hacerle al mdico cualquier pregunta que tenga. Document Released: 12/20/2016 Document Revised: 12/20/2016 Document Reviewed: 08/22/2015 Elsevier Interactive Patient Education  2018 [employer].     IF you received an x-ray today, you will receive an invoice from [facility]. Please contact [facility] at [phone] with questions or concerns regarding your invoice.   IF you received labwork today, you will receive an invoice from [place]. Please  contact [facility] at [phone] with questions or concerns regarding your invoice.   Our billing staff will not be able to assist you with questions regarding bills from these companies.  You will be contacted with the lab results as soon as they are available. The fastest way to get your results is to activate your My Chart account. Instructions are located on the last page of this paperwork. If you have not heard from [place] regarding the results in 2 weeks, please contact this office.

## 2018-01-21 NOTE — Progress Notes (Signed)
    MRN: [id number] DOB: 02-01-1966  Subjective:   Courtney Foster is a 52 y.o. female presenting for oval of multiple skin tags and skin lesions over her neck lower abdomen and left axilla.  Patient reports that she has had these for years and generally do not bother her apart from some occasional itching.  Denies fever, drainage of pus or bleeding, history of skin cancer.  Reports that her mother and grandmother also had multiple skin lesions of this nature.  She has never been evaluated by dermatologist.  Denton Brick has a current medication list which includes the following prescription(s): levothyroxine and meloxicam. Also has No Known Allergies.  Courtney Foster  has a past medical history of Arthritis, Thyroid disease, and Type 2 diabetes mellitus without complication, without long-term current use of insulin ([place]) (08/11/2017). Also  has a past surgical history that includes Cesarean section.  Objective:   Vitals: BP (!) 144/84   Pulse 76   Temp 98.3 F (36.8 C) (Oral)   Resp 18   Ht 5\' 5"  (1.651 m)   Wt 198 lb 12.8 oz (90.2 kg)   LMP 11/29/2013   SpO2 99%   BMI 33.08 kg/m   Physical Exam  Constitutional: She is oriented to person, place, and time. She appears well-developed and well-nourished.  Cardiovascular: Normal rate.  Pulmonary/Chest: Effort normal.  Neurological: She is alert and oriented to person, place, and time.  Skin:  Approximately 50 skin lesions/skin tags of varying sizes between 0.5-2cm diffusely scattered over her neck, left axillary area and lower abdomen.  Psychiatric: She has a normal mood and affect.   Assessment and Plan :   Skin tags, multiple acquired - Plan: Ambulatory referral to Dermatology  Skin lesions, generalized - Plan: Ambulatory referral to Dermatology  Itching  Referral to dermatology pending for consult/removal of multiple skin lesions/skin tags.   Jaynee Eagles, PA-C [facility] [phone] 01/21/2018  12:17  PM

## 2018-03-18 ENCOUNTER — Other Ambulatory Visit: Payer: BLUE CROSS/BLUE SHIELD

## 2018-03-18 ENCOUNTER — Ambulatory Visit: Payer: BLUE CROSS/BLUE SHIELD | Admitting: Obstetrics & Gynecology

## 2018-04-16 ENCOUNTER — Other Ambulatory Visit: Payer: BLUE CROSS/BLUE SHIELD

## 2018-04-16 ENCOUNTER — Ambulatory Visit: Payer: BLUE CROSS/BLUE SHIELD | Admitting: Obstetrics & Gynecology

## 2018-06-10 DIAGNOSIS — E039 Hypothyroidism, unspecified: Secondary | ICD-10-CM | POA: Diagnosis not present

## 2018-07-05 ENCOUNTER — Ambulatory Visit (INDEPENDENT_AMBULATORY_CARE_PROVIDER_SITE_OTHER): Payer: BLUE CROSS/BLUE SHIELD | Admitting: Obstetrics & Gynecology

## 2018-07-05 ENCOUNTER — Encounter: Payer: Self-pay | Admitting: Obstetrics & Gynecology

## 2018-07-05 VITALS — BP 122/80 | Ht 65.0 in | Wt 199.0 lb

## 2018-07-05 DIAGNOSIS — E039 Hypothyroidism, unspecified: Secondary | ICD-10-CM

## 2018-07-05 DIAGNOSIS — Z01419 Encounter for gynecological examination (general) (routine) without abnormal findings: Secondary | ICD-10-CM

## 2018-07-05 DIAGNOSIS — Z13220 Encounter for screening for lipoid disorders: Secondary | ICD-10-CM

## 2018-07-05 DIAGNOSIS — Z1321 Encounter for screening for nutritional disorder: Secondary | ICD-10-CM

## 2018-07-05 NOTE — Progress Notes (Signed)
Patient came for Annual/Gyn visit, but not due yet as she had it on 10/26/2017.  Pap negative/HPV HR neg 10/26/2017.  Due for for Fasting Health Labs.  TSH, CBC, CMP, FLP, Vit D done today.

## 2018-07-09 LAB — COMPREHENSIVE METABOLIC PANEL
AG Ratio: 1.4 (calc) (ref 1.0–2.5)
ALT: 83 U/L — ABNORMAL HIGH (ref 6–29)
AST: 54 U/L — ABNORMAL HIGH (ref 10–35)
Albumin: 4.4 g/dL (ref 3.6–5.1)
Alkaline phosphatase (APISO): 80 U/L (ref 33–130)
BUN: 17 mg/dL (ref 7–25)
CO2: 28 mmol/L (ref 20–32)
Calcium: 9.4 mg/dL (ref 8.6–10.4)
Chloride: 102 mmol/L (ref 98–110)
Creat: 0.78 mg/dL (ref 0.50–1.05)
Globulin: 3.1 g/dL (calc) (ref 1.9–3.7)
Glucose, Bld: 91 mg/dL (ref 65–99)
Potassium: 4.4 mmol/L (ref 3.5–5.3)
Sodium: 138 mmol/L (ref 135–146)
Total Bilirubin: 0.4 mg/dL (ref 0.2–1.2)
Total Protein: 7.5 g/dL (ref 6.1–8.1)

## 2018-07-09 LAB — CBC WITH DIFFERENTIAL/PLATELET
Basophils Absolute: 62 cells/uL (ref 0–200)
Basophils Relative: 1 %
Eosinophils Absolute: 143 cells/uL (ref 15–500)
Eosinophils Relative: 2.3 %
HCT: 38.6 % (ref 35.0–45.0)
Hemoglobin: 13.2 g/dL (ref 11.7–15.5)
Lymphs Abs: 3137 cells/uL (ref 850–3900)
MCH: 29.3 pg (ref 27.0–33.0)
MCHC: 34.2 g/dL (ref 32.0–36.0)
MCV: 85.6 fL (ref 80.0–100.0)
MPV: 10.9 fL (ref 7.5–12.5)
Monocytes Relative: 8.8 %
Neutro Abs: 2313 cells/uL (ref 1500–7800)
Neutrophils Relative %: 37.3 %
Platelets: 258 10*3/uL (ref 140–400)
RBC: 4.51 10*6/uL (ref 3.80–5.10)
RDW: 13.8 % (ref 11.0–15.0)
Total Lymphocyte: 50.6 %
WBC mixed population: 546 cells/uL (ref 200–950)
WBC: 6.2 10*3/uL (ref 3.8–10.8)

## 2018-07-09 LAB — T4, FREE: Free T4: 1.2 ng/dL (ref 0.8–1.8)

## 2018-07-09 LAB — TEST AUTHORIZATION

## 2018-07-09 LAB — LIPID PANEL
Cholesterol: 152 mg/dL (ref <200)
HDL: 40 mg/dL — ABNORMAL LOW (ref >50)
LDL Cholesterol (Calc): 91 mg/dL (calc)
Non-HDL Cholesterol (Calc): 112 mg/dL (calc) (ref <130)
Total CHOL/HDL Ratio: 3.8 (calc) (ref <5.0)
Triglycerides: 109 mg/dL (ref <150)

## 2018-07-09 LAB — VITAMIN D 25 HYDROXY (VIT D DEFICIENCY, FRACTURES): Vit D, 25-Hydroxy: 22 ng/mL — ABNORMAL LOW (ref 30–100)

## 2018-07-09 LAB — T3, FREE: T3, Free: 2.7 pg/mL (ref 2.3–4.2)

## 2018-07-09 LAB — TSH: TSH: 7.88 mIU/L — ABNORMAL HIGH

## 2018-07-26 ENCOUNTER — Other Ambulatory Visit: Payer: Self-pay | Admitting: Anesthesiology

## 2018-07-26 MED ORDER — VITAMIN D (ERGOCALCIFEROL) 1.25 MG (50000 UNIT) PO CAPS: 50000.0000 [IU] | ORAL_CAPSULE | ORAL | 0 refills | Status: DC

## 2018-08-05 ENCOUNTER — Encounter: Payer: BLUE CROSS/BLUE SHIELD | Admitting: Obstetrics & Gynecology

## 2018-10-08 DIAGNOSIS — E049 Nontoxic goiter, unspecified: Secondary | ICD-10-CM | POA: Diagnosis not present

## 2018-10-08 DIAGNOSIS — E063 Autoimmune thyroiditis: Secondary | ICD-10-CM | POA: Diagnosis not present

## 2018-10-08 DIAGNOSIS — E039 Hypothyroidism, unspecified: Secondary | ICD-10-CM | POA: Diagnosis not present

## 2018-10-08 DIAGNOSIS — K5900 Constipation, unspecified: Secondary | ICD-10-CM | POA: Diagnosis not present

## 2018-10-14 ENCOUNTER — Other Ambulatory Visit: Payer: Self-pay | Admitting: Obstetrics & Gynecology

## 2019-07-04 ENCOUNTER — Other Ambulatory Visit: Payer: Self-pay

## 2019-07-07 ENCOUNTER — Other Ambulatory Visit: Payer: Self-pay

## 2019-07-07 ENCOUNTER — Ambulatory Visit (INDEPENDENT_AMBULATORY_CARE_PROVIDER_SITE_OTHER): Payer: BC Managed Care – PPO | Admitting: Obstetrics & Gynecology

## 2019-07-07 ENCOUNTER — Encounter: Payer: Self-pay | Admitting: Obstetrics & Gynecology

## 2019-07-07 VITALS — BP 160/90 | Ht 60.5 in | Wt 200.0 lb

## 2019-07-07 DIAGNOSIS — Z01419 Encounter for gynecological examination (general) (routine) without abnormal findings: Secondary | ICD-10-CM

## 2019-07-07 DIAGNOSIS — E786 Lipoprotein deficiency: Secondary | ICD-10-CM | POA: Diagnosis not present

## 2019-07-07 DIAGNOSIS — Z6838 Body mass index (BMI) 38.0-38.9, adult: Secondary | ICD-10-CM

## 2019-07-07 DIAGNOSIS — Z780 Asymptomatic menopausal state: Secondary | ICD-10-CM

## 2019-07-07 DIAGNOSIS — E559 Vitamin D deficiency, unspecified: Secondary | ICD-10-CM | POA: Diagnosis not present

## 2019-07-07 DIAGNOSIS — E039 Hypothyroidism, unspecified: Secondary | ICD-10-CM | POA: Diagnosis not present

## 2019-07-07 NOTE — Patient Instructions (Signed)
1. Well female exam with routine gynecological exam Normal gynecologic exam.  Pap negative with high-risk HPV negative in March 2019, no indication to repeat this year.  Breast exam normal.  Will schedule screening mammogram now.  Colonoscopy in 2018.  Fasting health labs here today. - CBC - Comp Met (CMET) - Lipid panel - VITAMIN D 25 Hydroxy (Vit-D Deficiency, Fractures)  2. Postmenopause Well on no hormone replacement therapy.  No postmenopausal bleeding.  Recommend vitamin D supplements, calcium intake of 1200 mg daily and regular weightbearing physical activities.  3. Hypothyroidism, unspecified type Followed by [name].  Will do thyroid work-up with him.  4. Class 2 severe obesity due to excess calories with serious comorbidity and body mass index (BMI) of 38.0 to 38.9 in adult [facility]) Recommend a lower calorie/carb diet such as [employer].  Aerobic physical activities 5 times a week and weightlifting every 2 days.  Denton Brick, fue un placer verle hoy!  Voy a informarle de sus [employer].

## 2019-07-07 NOTE — Progress Notes (Signed)
Courtney Foster [birth year] [id number]   History:    53 y.o. G4P2A2L2 Married  RP:  Established patient presenting for annual gyn exam   HPI: [place] 32.4 in 10/2017.  Postmenopausal, well on no HRT.  No PMB.  No hot flushes.  No pelvic pain.  Abstinent currently.  Urine and bowel movements normal.  Breasts normal.  Hypothyroidism followed by Dr Kalman Shan.  Past medical history,surgical history, family history and social history were all reviewed and documented in the EPIC chart.  Gynecologic History Patient's last menstrual period was 11/29/2013. Contraception: abstinence and post menopausal status Last Pap: 10/2017. Results were: Negative/HPV HR neg Last mammogram: 10/2017. Results were: Negative Bone Density: Never Colonoscopy: 2018  Obstetric History OB History  Gravida Para Term Preterm AB Living  4 2     2 2   SAB TAB Ectopic Multiple Live Births  2   0        # Outcome Date GA Lbr Len/2nd Weight Sex Delivery Anes PTL Lv  4 SAB           3 SAB           2 Para           1 Para              ROS: A ROS was performed and pertinent positives and negatives are included in the history.  GENERAL: No fevers or chills. HEENT: No change in vision, no earache, sore throat or sinus congestion. NECK: No pain or stiffness. CARDIOVASCULAR: No chest pain or pressure. No palpitations. PULMONARY: No shortness of breath, cough or wheeze. GASTROINTESTINAL: No abdominal pain, nausea, vomiting or diarrhea, melena or bright red blood per rectum. GENITOURINARY: No urinary frequency, urgency, hesitancy or dysuria. MUSCULOSKELETAL: No joint or muscle pain, no back pain, no recent trauma. DERMATOLOGIC: No rash, no itching, no lesions. ENDOCRINE: No polyuria, polydipsia, no heat or cold intolerance. No recent change in weight. HEMATOLOGICAL: No anemia or easy bruising or bleeding. NEUROLOGIC: No headache, seizures, numbness, tingling or weakness. PSYCHIATRIC: No depression, no loss of interest in normal activity  or change in sleep pattern.     Exam:   BP (!) 160/90   Ht 5' 0.5" (1.537 m)   Wt 200 lb (90.7 kg)   LMP 11/29/2013   BMI 38.42 kg/m   Body mass index is 38.42 kg/m.  General appearance : Well developed well nourished female. No acute distress HEENT: Eyes: no retinal hemorrhage or exudates,  Neck supple, trachea midline, no carotid bruits, no thyroidmegaly Lungs: Clear to auscultation, no rhonchi or wheezes, or rib retractions  Heart: Regular rate and rhythm, no murmurs or gallops Breast:Examined in sitting and supine position were symmetrical in appearance, no palpable masses or tenderness,  no skin retraction, no nipple inversion, no nipple discharge, no skin discoloration, no axillary or supraclavicular lymphadenopathy Abdomen: no palpable masses or tenderness, no rebound or guarding Extremities: no edema or skin discoloration or tenderness  Pelvic: Vulva: Normal             Vagina: No gross lesions or discharge  Cervix: No gross lesions or discharge  Uterus  AV, normal size, shape and consistency, non-tender and mobile  Adnexa  Without masses or tenderness  Anus: Normal   Assessment/Plan:  53 y.o. female for annual exam   1. Well female exam with routine gynecological exam Normal gynecologic exam.  Pap negative with high-risk HPV negative in March 2019, no indication to repeat this year.  Breast exam normal.  Will schedule screening mammogram now.  Colonoscopy in 2018.  Fasting health labs here today. - CBC - Comp Met (CMET) - Lipid panel - VITAMIN D 25 Hydroxy (Vit-D Deficiency, Fractures)  2. Postmenopause Well on no hormone replacement therapy.  No postmenopausal bleeding.  Recommend vitamin D supplements, calcium intake of 1200 mg daily and regular weightbearing physical activities.  3. Hypothyroidism, unspecified type Followed by [name].  Will do thyroid work-up with him.  4. Class 2 severe obesity due to excess calories with serious comorbidity and body mass  index (BMI) of 38.0 to 38.9 in adult [facility]) Recommend a lower calorie/carb diet such as [employer].  Aerobic physical activities 5 times a week and weightlifting every 2 days.  Princess Bruins MD, 11:22 AM 07/07/2019

## 2019-07-08 LAB — CBC
HCT: 41.1 % (ref 35.0–45.0)
Hemoglobin: 13.7 g/dL (ref 11.7–15.5)
MCH: 29.8 pg (ref 27.0–33.0)
MCHC: 33.3 g/dL (ref 32.0–36.0)
MCV: 89.3 fL (ref 80.0–100.0)
MPV: 11 fL (ref 7.5–12.5)
Platelets: 246 10*3/uL (ref 140–400)
RBC: 4.6 10*6/uL (ref 3.80–5.10)
RDW: 13.4 % (ref 11.0–15.0)
WBC: 5.5 10*3/uL (ref 3.8–10.8)

## 2019-07-08 LAB — LIPID PANEL
Cholesterol: 197 mg/dL (ref <200)
HDL: 45 mg/dL — ABNORMAL LOW (ref >=50)
LDL Cholesterol (Calc): 126 mg/dL (calc) — ABNORMAL HIGH
Non-HDL Cholesterol (Calc): 152 mg/dL (calc) — ABNORMAL HIGH (ref <130)
Total CHOL/HDL Ratio: 4.4 (calc) (ref <5.0)
Triglycerides: 148 mg/dL (ref <150)

## 2019-07-08 LAB — COMPREHENSIVE METABOLIC PANEL
AG Ratio: 1.6 (calc) (ref 1.0–2.5)
ALT: 73 U/L — ABNORMAL HIGH (ref 6–29)
AST: 51 U/L — ABNORMAL HIGH (ref 10–35)
Albumin: 4.7 g/dL (ref 3.6–5.1)
Alkaline phosphatase (APISO): 79 U/L (ref 37–153)
BUN: 14 mg/dL (ref 7–25)
CO2: 30 mmol/L (ref 20–32)
Calcium: 9.7 mg/dL (ref 8.6–10.4)
Chloride: 104 mmol/L (ref 98–110)
Creat: 0.89 mg/dL (ref 0.50–1.05)
Globulin: 2.9 g/dL (calc) (ref 1.9–3.7)
Glucose, Bld: 96 mg/dL (ref 65–99)
Potassium: 3.8 mmol/L (ref 3.5–5.3)
Sodium: 142 mmol/L (ref 135–146)
Total Bilirubin: 0.5 mg/dL (ref 0.2–1.2)
Total Protein: 7.6 g/dL (ref 6.1–8.1)

## 2019-07-08 LAB — VITAMIN D 25 HYDROXY (VIT D DEFICIENCY, FRACTURES): Vit D, 25-Hydroxy: 17 ng/mL — ABNORMAL LOW (ref 30–100)

## 2019-07-11 ENCOUNTER — Other Ambulatory Visit: Payer: Self-pay | Admitting: Anesthesiology

## 2019-07-11 MED ORDER — VITAMIN D (ERGOCALCIFEROL) 1.25 MG (50000 UNIT) PO CAPS: 50000.0000 [IU] | ORAL_CAPSULE | ORAL | 0 refills | Status: DC

## 2019-09-26 ENCOUNTER — Other Ambulatory Visit: Payer: Self-pay | Admitting: Obstetrics & Gynecology

## 2019-12-01 DIAGNOSIS — E063 Autoimmune thyroiditis: Secondary | ICD-10-CM | POA: Diagnosis not present

## 2019-12-01 DIAGNOSIS — K5900 Constipation, unspecified: Secondary | ICD-10-CM | POA: Diagnosis not present

## 2019-12-01 DIAGNOSIS — E049 Nontoxic goiter, unspecified: Secondary | ICD-10-CM | POA: Diagnosis not present

## 2019-12-01 DIAGNOSIS — E039 Hypothyroidism, unspecified: Secondary | ICD-10-CM | POA: Diagnosis not present

## 2019-12-03 ENCOUNTER — Other Ambulatory Visit: Payer: Self-pay | Admitting: Obstetrics & Gynecology

## 2019-12-29 ENCOUNTER — Other Ambulatory Visit: Payer: Self-pay | Admitting: Obstetrics & Gynecology

## 2019-12-29 DIAGNOSIS — Z1231 Encounter for screening mammogram for malignant neoplasm of breast: Secondary | ICD-10-CM

## 2019-12-30 ENCOUNTER — Other Ambulatory Visit: Payer: Self-pay

## 2019-12-30 ENCOUNTER — Ambulatory Visit
Admission: RE | Admit: 2019-12-30 | Discharge: 2019-12-30 | Disposition: A | Discharge status: Home / Self Care | Payer: BLUE CROSS/BLUE SHIELD | Source: Ambulatory Visit | Attending: Obstetrics & Gynecology | Admitting: Obstetrics & Gynecology

## 2019-12-30 DIAGNOSIS — Z1231 Encounter for screening mammogram for malignant neoplasm of breast: Secondary | ICD-10-CM | POA: Diagnosis not present

## 2020-07-12 ENCOUNTER — Encounter: Payer: BC Managed Care – PPO | Admitting: Obstetrics & Gynecology

## 2020-07-13 ENCOUNTER — Ambulatory Visit (INDEPENDENT_AMBULATORY_CARE_PROVIDER_SITE_OTHER): Payer: BC Managed Care – PPO | Admitting: Obstetrics & Gynecology

## 2020-07-13 ENCOUNTER — Encounter: Payer: Self-pay | Admitting: Obstetrics & Gynecology

## 2020-07-13 ENCOUNTER — Other Ambulatory Visit: Payer: Self-pay

## 2020-07-13 VITALS — BP 128/84 | Ht 60.5 in | Wt 201.0 lb

## 2020-07-13 DIAGNOSIS — Z780 Asymptomatic menopausal state: Secondary | ICD-10-CM

## 2020-07-13 DIAGNOSIS — Z6838 Body mass index (BMI) 38.0-38.9, adult: Secondary | ICD-10-CM

## 2020-07-13 DIAGNOSIS — E559 Vitamin D deficiency, unspecified: Secondary | ICD-10-CM | POA: Diagnosis not present

## 2020-07-13 DIAGNOSIS — Z01419 Encounter for gynecological examination (general) (routine) without abnormal findings: Secondary | ICD-10-CM | POA: Diagnosis not present

## 2020-07-13 DIAGNOSIS — E785 Hyperlipidemia, unspecified: Secondary | ICD-10-CM | POA: Diagnosis not present

## 2020-07-13 DIAGNOSIS — E039 Hypothyroidism, unspecified: Secondary | ICD-10-CM | POA: Diagnosis not present

## 2020-07-13 LAB — CBC: MCV: 89.3 fL (ref 80.0–100.0)

## 2020-07-13 LAB — COMPREHENSIVE METABOLIC PANEL
Albumin: 4.6 g/dL (ref 3.6–5.1)
CO2: 30 mmol/L (ref 20–32)
Globulin: 3.2 g/dL (calc) (ref 1.9–3.7)

## 2020-07-13 LAB — LIPID PANEL: LDL Cholesterol (Calc): 110 mg/dL (calc) — ABNORMAL HIGH

## 2020-07-13 NOTE — Addendum Note (Signed)
Addended by: Thurnell Garbe A on: 07/13/2020 12:24 PM   Modules accepted: Orders

## 2020-07-13 NOTE — Progress Notes (Signed)
Danaka Llera 1966-03-10 [id number]   History:    54 y.o. G4P2A2L2 Separated.  2 daughters doing well.  NL:[id number] presenting for annual gyn exam   HUD:[id number], well on no HRT.  No PMB.  No hot flushes. No pelvic pain. Abstinent, separated from husband. Urine and bowel movements normal. Breasts normal.  BMI 38.61. Hypothyroidism followed by Dr Kalman Shan.  Past medical history,surgical history, family history and social history were all reviewed and documented in the EPIC chart.  Gynecologic History Patient's last menstrual period was 11/29/2013.  Obstetric History OB History  Gravida Para Term Preterm AB Living  _0 SAB TAB Ectopic Multiple Live Births  2   0        # Outcome Date GA Lbr Len/2nd Weight Sex Delivery Anes PTL Lv  4 SAB           3 SAB           2 Para           1 Para              ROS: A ROS was performed and pertinent positives and negatives are included in the history.  GENERAL: No fevers or chills. HEENT: No change in vision, no earache, sore throat or sinus congestion. NECK: No pain or stiffness. CARDIOVASCULAR: No chest pain or pressure. No palpitations. PULMONARY: No shortness of breath, cough or wheeze. GASTROINTESTINAL: No abdominal pain, nausea, vomiting or diarrhea, melena or bright red blood per rectum. GENITOURINARY: No urinary frequency, urgency, hesitancy or dysuria. MUSCULOSKELETAL: No joint or muscle pain, no back pain, no recent trauma. DERMATOLOGIC: No rash, no itching, no lesions. ENDOCRINE: No polyuria, polydipsia, no heat or cold intolerance. No recent change in weight. HEMATOLOGICAL: No anemia or easy bruising or bleeding. NEUROLOGIC: No headache, seizures, numbness, tingling or weakness. PSYCHIATRIC: No depression, no loss of interest in normal activity or change in sleep pattern.     Exam:   BP 128/84   Ht 5' 0.5" (1.537 m)   Wt 201 lb (91.2 kg)   LMP 11/29/2013   BMI 38.61 kg/m   Body mass index is  38.61 kg/m.  General appearance : Well developed well nourished female. No acute distress HEENT: Eyes: no retinal hemorrhage or exudates,  Neck supple, trachea midline, no carotid bruits, no thyroidmegaly Lungs: Clear to auscultation, no rhonchi or wheezes, or rib retractions  Heart: Regular rate and rhythm, no murmurs or gallops Breast:Examined in sitting and supine position were symmetrical in appearance, no palpable masses or tenderness,  no skin retraction, no nipple inversion, no nipple discharge, no skin discoloration, no axillary or supraclavicular lymphadenopathy Abdomen: no palpable masses or tenderness, no rebound or guarding Extremities: no edema or skin discoloration or tenderness  Pelvic: Vulva: Normal             Vagina: No gross lesions or discharge  Cervix: No gross lesions or discharge.  Pap reflex done.  Uterus  AV, normal size, shape and consistency, non-tender and mobile  Adnexa  Without masses or tenderness  Anus: Normal   Assessment/Plan:  54 y.o. female for annual exam   1. Encounter for routine gynecological examination with Papanicolaou smear of cervix Normal gynecologic exam in menopause. Pap reflex done. Breast exam normal. Screening mammogram May 2021 was negative. Colonoscopy 2018. Fasting health labs here today. - CBC - Comp Met (CMET) - Lipid panel - TSH - VITAMIN D 25 Hydroxy (Vit-D Deficiency, Fractures)  2. Postmenopause Well on no hormone replacement therapy. No postmenopausal bleeding. Recommend vitamin D supplements, calcium intake of 1500 mg daily and regular weightbearing physical activities.  3. Class 2 severe obesity due to excess calories with serious comorbidity and body mass index (BMI) of 38.0 to 38.9 in adult [facility]) Lower calorie/carb diet. Aerobic activities 5 times a week and light weightlifting every 2 days.  Other orders - OVER THE COUNTER MEDICATION; TRI-FACTOR FORMULA WITH ZINC  Princess Bruins MD, 10:57 AM 07/13/2020

## 2020-07-15 LAB — LIPID PANEL
Cholesterol: 184 mg/dL (ref <200)
HDL: 47 mg/dL — ABNORMAL LOW (ref >=50)
Non-HDL Cholesterol (Calc): 137 mg/dL (calc) — ABNORMAL HIGH (ref <130)
Total CHOL/HDL Ratio: 3.9 (calc) (ref <5.0)
Triglycerides: 154 mg/dL — ABNORMAL HIGH (ref <150)

## 2020-07-15 LAB — CBC
HCT: 41.9 % (ref 35.0–45.0)
Hemoglobin: 14.3 g/dL (ref 11.7–15.5)
MCH: 30.5 pg (ref 27.0–33.0)
MCHC: 34.1 g/dL (ref 32.0–36.0)
MPV: 10.8 fL (ref 7.5–12.5)
Platelets: 263 10*3/uL (ref 140–400)
RBC: 4.69 10*6/uL (ref 3.80–5.10)
RDW: 14.1 % (ref 11.0–15.0)
WBC: 5.9 10*3/uL (ref 3.8–10.8)

## 2020-07-15 LAB — COMPREHENSIVE METABOLIC PANEL
AG Ratio: 1.4 (calc) (ref 1.0–2.5)
ALT: 122 U/L — ABNORMAL HIGH (ref 6–29)
AST: 73 U/L — ABNORMAL HIGH (ref 10–35)
Alkaline phosphatase (APISO): 97 U/L (ref 37–153)
BUN: 13 mg/dL (ref 7–25)
Calcium: 9.6 mg/dL (ref 8.6–10.4)
Chloride: 103 mmol/L (ref 98–110)
Creat: 0.83 mg/dL (ref 0.50–1.05)
Glucose, Bld: 98 mg/dL (ref 65–99)
Potassium: 4.3 mmol/L (ref 3.5–5.3)
Sodium: 141 mmol/L (ref 135–146)
Total Bilirubin: 0.4 mg/dL (ref 0.2–1.2)
Total Protein: 7.8 g/dL (ref 6.1–8.1)

## 2020-07-15 LAB — T4, FREE: Free T4: 0.7 ng/dL — ABNORMAL LOW (ref 0.8–1.8)

## 2020-07-15 LAB — T3, FREE: T3, Free: 2.1 pg/mL — ABNORMAL LOW (ref 2.3–4.2)

## 2020-07-15 LAB — TSH: TSH: 60.36 mIU/L — ABNORMAL HIGH (ref 0.40–4.50)

## 2020-07-15 LAB — VITAMIN D 25 HYDROXY (VIT D DEFICIENCY, FRACTURES): Vit D, 25-Hydroxy: 24 ng/mL — ABNORMAL LOW (ref 30–100)

## 2020-07-19 LAB — PAP IG W/ RFLX HPV ASCU

## 2020-11-29 DIAGNOSIS — E559 Vitamin D deficiency, unspecified: Secondary | ICD-10-CM | POA: Diagnosis not present

## 2020-11-29 DIAGNOSIS — K5900 Constipation, unspecified: Secondary | ICD-10-CM | POA: Diagnosis not present

## 2020-11-29 DIAGNOSIS — E049 Nontoxic goiter, unspecified: Secondary | ICD-10-CM | POA: Diagnosis not present

## 2020-11-29 DIAGNOSIS — E063 Autoimmune thyroiditis: Secondary | ICD-10-CM | POA: Diagnosis not present

## 2020-11-29 DIAGNOSIS — E039 Hypothyroidism, unspecified: Secondary | ICD-10-CM | POA: Diagnosis not present

## 2021-01-31 DIAGNOSIS — E039 Hypothyroidism, unspecified: Secondary | ICD-10-CM | POA: Diagnosis not present

## 2021-01-31 DIAGNOSIS — E063 Autoimmune thyroiditis: Secondary | ICD-10-CM | POA: Diagnosis not present

## 2021-03-28 DIAGNOSIS — E063 Autoimmune thyroiditis: Secondary | ICD-10-CM | POA: Diagnosis not present

## 2021-03-28 DIAGNOSIS — E039 Hypothyroidism, unspecified: Secondary | ICD-10-CM | POA: Diagnosis not present

## 2021-07-18 ENCOUNTER — Ambulatory Visit: Payer: BC Managed Care – PPO | Admitting: Obstetrics & Gynecology

## 2021-07-19 ENCOUNTER — Encounter: Payer: Self-pay | Admitting: Obstetrics & Gynecology

## 2021-07-19 ENCOUNTER — Ambulatory Visit (INDEPENDENT_AMBULATORY_CARE_PROVIDER_SITE_OTHER): Payer: BC Managed Care – PPO | Admitting: Obstetrics & Gynecology

## 2021-07-19 ENCOUNTER — Other Ambulatory Visit: Payer: Self-pay

## 2021-07-19 VITALS — BP 112/70 | HR 95 | Resp 16 | Ht 60.0 in | Wt 203.0 lb

## 2021-07-19 DIAGNOSIS — Z780 Asymptomatic menopausal state: Secondary | ICD-10-CM | POA: Diagnosis not present

## 2021-07-19 DIAGNOSIS — Z6839 Body mass index (BMI) 39.0-39.9, adult: Secondary | ICD-10-CM | POA: Diagnosis not present

## 2021-07-19 DIAGNOSIS — E6609 Other obesity due to excess calories: Secondary | ICD-10-CM

## 2021-07-19 DIAGNOSIS — Z01419 Encounter for gynecological examination (general) (routine) without abnormal findings: Secondary | ICD-10-CM

## 2021-07-19 NOTE — Progress Notes (Signed)
Courtney Foster 09-24-1965 [id number]   History:    55 y.o. G4P2A2L2 Separated.  2 daughters doing well.   RP:  Established patient presenting for annual gyn exam    HPI: Postmenopausal, well on no HRT.  No PMB.  No hot flushes.  No pelvic pain.  Abstinent, separated from husband.  Pap Neg 07/13/2020.  Urine and bowel movements normal.  Breasts normal. Will schedule screen Mammo now.  BMI 39.65. Colono Benign Polyps 10/2016.  Hypothyroidism followed by Dr Kalman Shan.   Past medical history,surgical history, family history and social history were all reviewed and documented in the EPIC chart.  Gynecologic History Patient's last menstrual period was 11/29/2013.  Obstetric History OB History  Gravida Para Term Preterm AB Living  4 2     2 2   SAB IAB Ectopic Multiple Live Births  2   0        # Outcome Date GA Lbr Len/2nd Weight Sex Delivery Anes PTL Lv  4 SAB           3 SAB           2 Para           1 Para              ROS: A ROS was performed and pertinent positives and negatives are included in the history.  GENERAL: No fevers or chills. HEENT: No change in vision, no earache, sore throat or sinus congestion. NECK: No pain or stiffness. CARDIOVASCULAR: No chest pain or pressure. No palpitations. PULMONARY: No shortness of breath, cough or wheeze. GASTROINTESTINAL: No abdominal pain, nausea, vomiting or diarrhea, melena or bright red blood per rectum. GENITOURINARY: No urinary frequency, urgency, hesitancy or dysuria. MUSCULOSKELETAL: No joint or muscle pain, no back pain, no recent trauma. DERMATOLOGIC: No rash, no itching, no lesions. ENDOCRINE: No polyuria, polydipsia, no heat or cold intolerance. No recent change in weight. HEMATOLOGICAL: No anemia or easy bruising or bleeding. NEUROLOGIC: No headache, seizures, numbness, tingling or weakness. PSYCHIATRIC: No depression, no loss of interest in normal activity or change in sleep pattern.     Exam:   BP 112/70   Pulse 95   Resp 16    Ht 5' (1.524 m)   Wt 203 lb (92.1 kg)   LMP 11/29/2013   BMI 39.65 kg/m   Body mass index is 39.65 kg/m.  General appearance : Well developed well nourished female. No acute distress HEENT: Eyes: no retinal hemorrhage or exudates,  Neck supple, trachea midline, no carotid bruits, no thyroidmegaly Lungs: Clear to auscultation, no rhonchi or wheezes, or rib retractions  Heart: Regular rate and rhythm, no murmurs or gallops Breast:Examined in sitting and supine position were symmetrical in appearance, no palpable masses or tenderness,  no skin retraction, no nipple inversion, no nipple discharge, no skin discoloration, no axillary or supraclavicular lymphadenopathy Abdomen: no palpable masses or tenderness, no rebound or guarding Extremities: no edema or skin discoloration or tenderness  Pelvic: Vulva: Normal             Vagina: No gross lesions or discharge  Cervix: No gross lesions or discharge  Uterus  AV, normal size, shape and consistency, non-tender and mobile  Adnexa  Without masses or tenderness  Anus: Normal   Assessment/Plan:  55 y.o. female for annual exam   1. Well female exam with routine gynecological exam Postmenopausal, well on no HRT.  No PMB.  No hot flushes.  No pelvic pain.  Abstinent,  separated from husband.  Pap Neg 07/13/2020.  Urine and bowel movements normal.  Breasts normal. Will schedule screen Mammo now.  BMI 39.65. Colono Benign Polyps 10/2016.  Hypothyroidism followed by Dr Kalman Shan. - CBC; Future - Comp Met (CMET); Future - TSH; Future - Lipid Profile; Future - Vitamin D 1,25 dihydroxy; Future  2. Postmenopause Postmenopausal, well on no HRT.  No PMB.  No hot flushes.  No pelvic pain.  Abstinent.  3. Class 2 obesity due to excess calories without serious comorbidity with body mass index (BMI) of 39.0 to 39.9 in adult Low calories/carb diet.  Increase fitness activities.  Other orders - VITAMIN D PO; Take by mouth. occ - UNABLE TO FIND; Med Name:  supplement for knee pain   Princess Bruins MD, 3:23 PM 07/19/2021

## 2021-07-20 ENCOUNTER — Other Ambulatory Visit: Payer: BC Managed Care – PPO

## 2021-07-20 DIAGNOSIS — Z01419 Encounter for gynecological examination (general) (routine) without abnormal findings: Secondary | ICD-10-CM

## 2021-07-23 LAB — CBC
HCT: 42.1 % (ref 35.0–45.0)
Hemoglobin: 14.1 g/dL (ref 11.7–15.5)
MCH: 30.7 pg (ref 27.0–33.0)
MCHC: 33.5 g/dL (ref 32.0–36.0)
MCV: 91.5 fL (ref 80.0–100.0)
MPV: 10.9 fL (ref 7.5–12.5)
Platelets: 259 10*3/uL (ref 140–400)
RBC: 4.6 10*6/uL (ref 3.80–5.10)
RDW: 13.8 % (ref 11.0–15.0)
WBC: 6.9 10*3/uL (ref 3.8–10.8)

## 2021-07-23 LAB — COMPREHENSIVE METABOLIC PANEL
AG Ratio: 1.4 (calc) (ref 1.0–2.5)
ALT: 250 U/L — ABNORMAL HIGH (ref 6–29)
AST: 139 U/L — ABNORMAL HIGH (ref 10–35)
Albumin: 4.5 g/dL (ref 3.6–5.1)
Alkaline phosphatase (APISO): 89 U/L (ref 37–153)
BUN: 13 mg/dL (ref 7–25)
CO2: 27 mmol/L (ref 20–32)
Calcium: 9.7 mg/dL (ref 8.6–10.4)
Chloride: 102 mmol/L (ref 98–110)
Creat: 0.85 mg/dL (ref 0.50–1.03)
Globulin: 3.2 g/dL (calc) (ref 1.9–3.7)
Glucose, Bld: 101 mg/dL — ABNORMAL HIGH (ref 65–99)
Potassium: 4.7 mmol/L (ref 3.5–5.3)
Sodium: 140 mmol/L (ref 135–146)
Total Bilirubin: 0.5 mg/dL (ref 0.2–1.2)
Total Protein: 7.7 g/dL (ref 6.1–8.1)

## 2021-07-23 LAB — VITAMIN D 1,25 DIHYDROXY
Vitamin D 1, 25 (OH)2 Total: 63 pg/mL (ref 18–72)
Vitamin D2 1, 25 (OH)2: <8 pg/mL
Vitamin D3 1, 25 (OH)2: 63 pg/mL

## 2021-07-23 LAB — LIPID PANEL
Cholesterol: 191 mg/dL (ref <200)
HDL: 48 mg/dL — ABNORMAL LOW (ref >=50)
LDL Cholesterol (Calc): 119 mg/dL (calc) — ABNORMAL HIGH
Non-HDL Cholesterol (Calc): 143 mg/dL (calc) — ABNORMAL HIGH (ref <130)
Total CHOL/HDL Ratio: 4 (calc) (ref <5.0)
Triglycerides: 129 mg/dL (ref <150)

## 2021-07-23 LAB — TSH: TSH: 25.28 mIU/L — ABNORMAL HIGH (ref 0.40–4.50)

## 2021-11-05 IMAGING — MG DIGITAL SCREENING BILAT W/ TOMO W/ CAD
8 series · 8 of 24 positions shown · non-contrast
Comparison: Previous exam(s).

CLINICAL DATA: Screening.

EXAM:
DIGITAL SCREENING BILATERAL MAMMOGRAM WITH TOMO AND CAD

[R CC synth-2D]
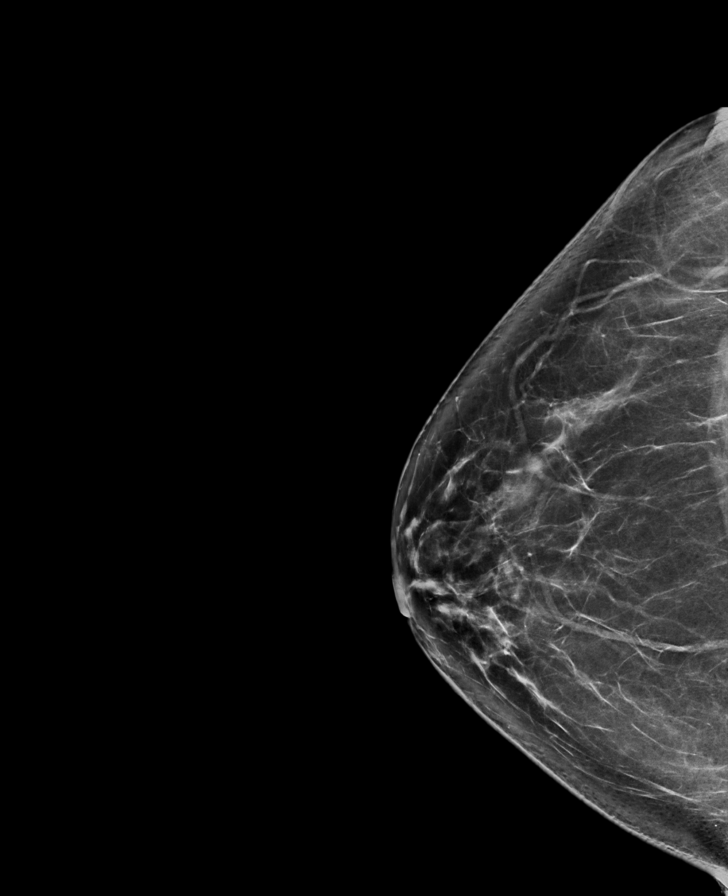

[R MLO synth-2D]
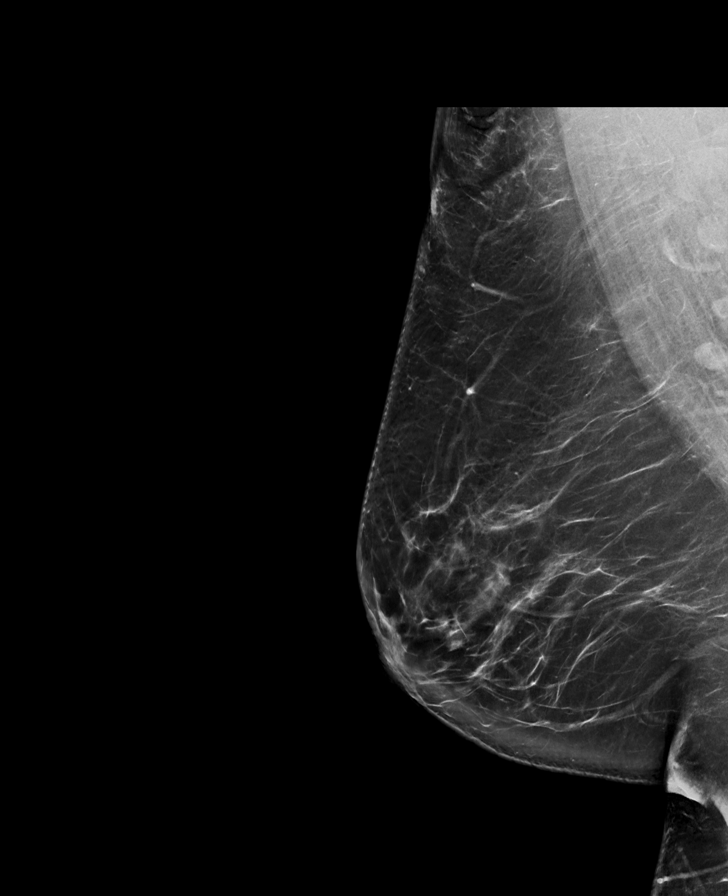

[L CC synth-2D]
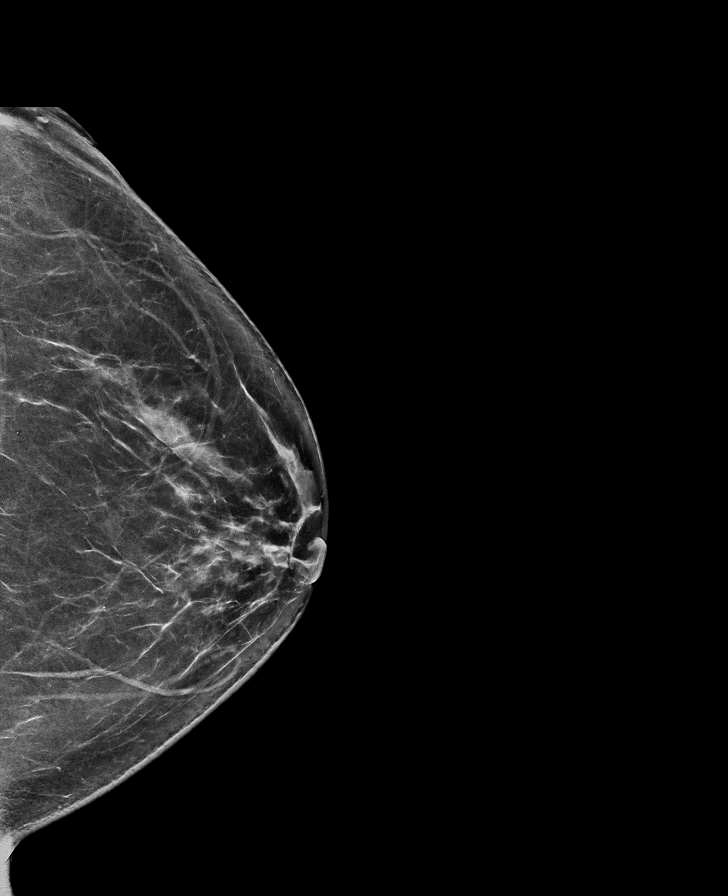

[L MLO synth-2D]
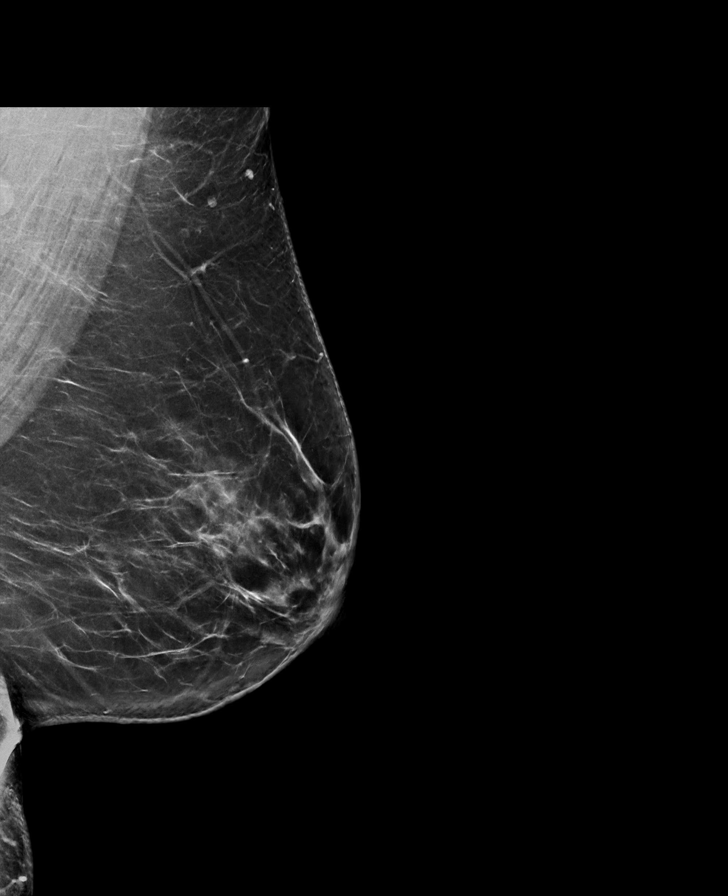

[R CC tomo · tomo slice 39/78.0]
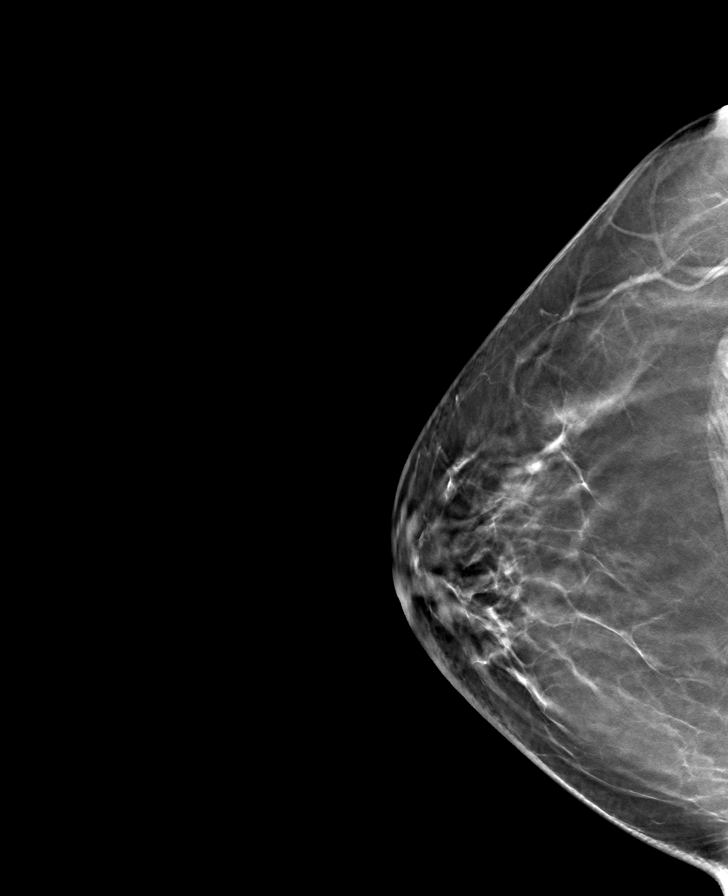

[L MLO tomo · tomo slice 43/84.0]
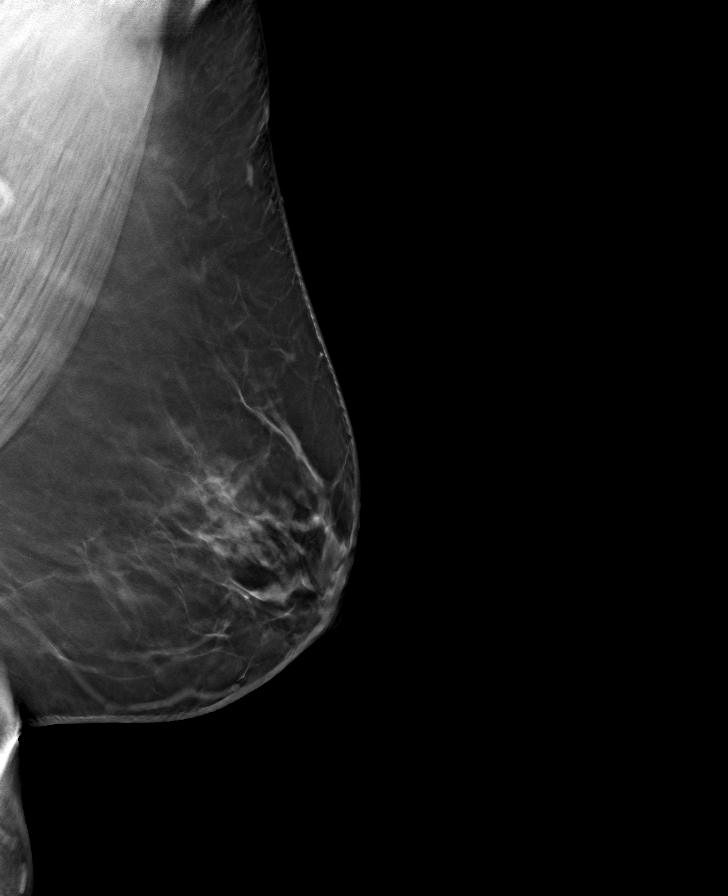

[R MLO tomo · tomo slice 47/94.0]
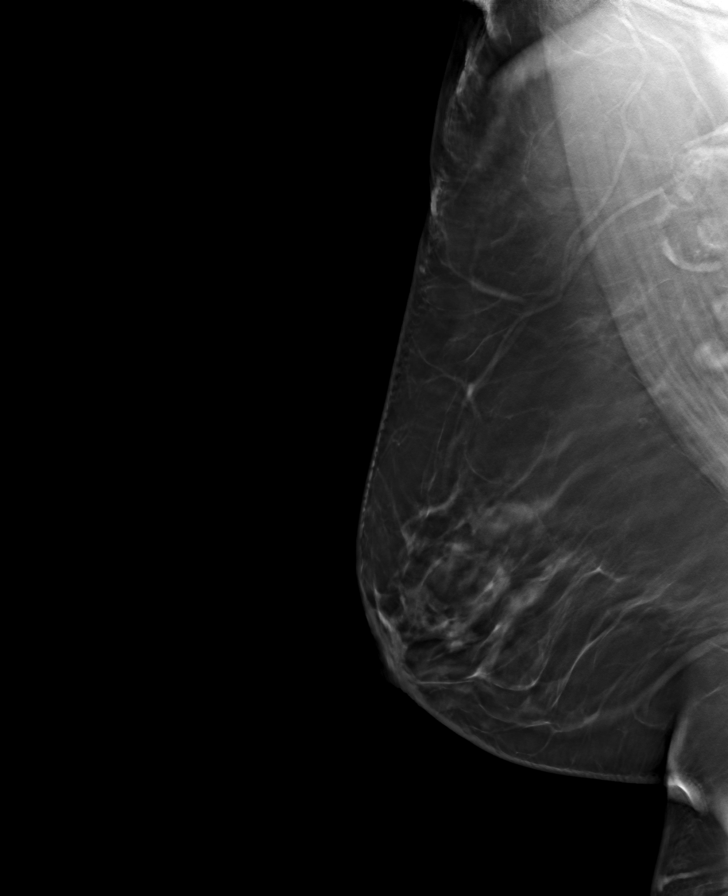

[L CC tomo · tomo slice 39/77.0]
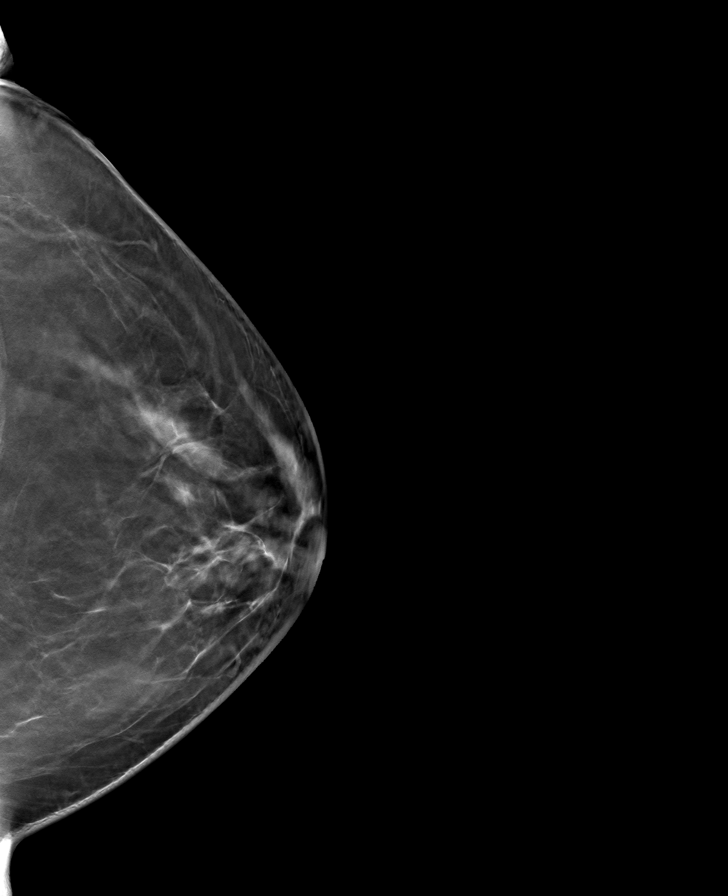

[8 of 24 positions shown; findings below may reference images not displayed]

ACR Breast Density Category b: There are scattered areas of
fibroglandular density.
FINDINGS: There are no findings suspicious for malignancy. Images were
processed with CAD.
IMPRESSION: No mammographic evidence of malignancy. A result letter of this
screening mammogram will be mailed directly to the patient.

RECOMMENDATION:
Screening mammogram in one year. (Code:[id number])

BI-RADS CATEGORY  1: Negative.

## 2022-07-11 ENCOUNTER — Ambulatory Visit (INDEPENDENT_AMBULATORY_CARE_PROVIDER_SITE_OTHER): Payer: BC Managed Care – PPO | Admitting: Radiology

## 2022-07-11 ENCOUNTER — Encounter: Payer: Self-pay | Admitting: Radiology

## 2022-07-11 VITALS — BP 124/76 | Ht 60.0 in | Wt 201.0 lb

## 2022-07-11 DIAGNOSIS — Z01419 Encounter for gynecological examination (general) (routine) without abnormal findings: Secondary | ICD-10-CM | POA: Diagnosis not present

## 2022-07-11 DIAGNOSIS — E039 Hypothyroidism, unspecified: Secondary | ICD-10-CM | POA: Diagnosis not present

## 2022-07-11 NOTE — Progress Notes (Signed)
   Courtney Foster 19-Jan-1966 [id number]   History:  56 y.o. G4P2 Spanish speaking female presents for annual exam. Hx of hypothyroidism, off meds x 4 months, missed her appt with endocrinology appt and can not get back in before she loses her insurance next week. No gyn concerns.  Gynecologic History Patient's last menstrual period was 11/29/2013.   Contraception/Family planning: post menopausal status Sexually active: yes Last Pap: 2021. Results were: normal Last mammogram: 12/30/19. Results were: normal  Obstetric History OB History  Gravida Para Term Preterm AB Living  '4 2     2 2  '$ SAB IAB Ectopic Multiple Live Births  2   0        # Outcome Date GA Lbr Len/2nd Weight Sex Delivery Anes PTL Lv  4 SAB           3 SAB           2 Para           1 Para              The following portions of the patient's history were reviewed and updated as appropriate: allergies, current medications, past family history, past medical history, past social history, past surgical history, and problem list.  Review of Systems Pertinent items noted in HPI and remainder of comprehensive ROS otherwise negative.   Past medical history, past surgical history, family history and social history were all reviewed and documented in the EPIC chart.   Exam:  Vitals:   07/11/22 1400  BP: 124/76  Weight: 201 lb (91.2 kg)  Height: 5' (1.524 m)   Body mass index is 39.26 kg/m.  General appearance:  Normal, obese Thyroid:  Symmetrical, normal in size, without palpable masses or nodularity. Respiratory  Auscultation:  Clear without wheezing or rhonchi Cardiovascular  Auscultation:  Regular rate, without rubs, murmurs or gallops  Edema/varicosities:  Not grossly evident Abdominal  Soft,nontender, without masses, guarding or rebound.  Liver/spleen:  No organomegaly noted  Hernia:  None appreciated  Skin  Inspection:  Grossly normal Breasts: Examined lying and sitting.   Right: Without masses,  retractions, nipple discharge or axillary adenopathy.   Left: Without masses, retractions, nipple discharge or axillary adenopathy. Genitourinary   Inguinal/mons:  Normal without inguinal adenopathy  External genitalia:  Normal appearing vulva with no masses, tenderness, or lesions  BUS/Urethra/Skene's glands:  Normal without masses or exudate  Vagina:  Normal appearing with normal color and discharge, no lesions  Cervix:  Normal appearing without discharge or lesions  Uterus:  Normal in size, shape and contour.  Mobile, nontender  Adnexa/parametria:     Rt: Normal in size, without masses or tenderness.   Lt: Normal in size, without masses or tenderness.  Anus and perineum: Normal   Patient informed chaperone available to be present for breast and pelvic exam. Patient has requested no chaperone to be present. Patient has been advised what will be completed during breast and pelvic exam.   Assessment/Plan:   1. Well woman exam with routine gynecological exam Pap due 2024 Schedule mammogram- overdue  2. Acquired hypothyroidism - Thyroid Panel With TSH     Discussed SBE, colonoscopy and DEXA screening as directed/appropriate. Recommend 155mns of exercise weekly, including weight bearing exercise. Encouraged the use of seatbelts and sunscreen. Return in 1 year for annual or as needed.   CRubbie BattiestB WHNP-BC 2:19 PM 07/11/2022

## 2022-07-12 ENCOUNTER — Other Ambulatory Visit: Payer: Self-pay | Admitting: *Deleted

## 2022-07-12 LAB — THYROID PANEL WITH TSH
Free Thyroxine Index: 0.4 — ABNORMAL LOW (ref 1.4–3.8)
T3 Uptake: 18 % — ABNORMAL LOW (ref 22–35)
T4, Total: 2.3 ug/dL — ABNORMAL LOW (ref 5.1–11.9)
TSH: 82.7 mIU/L — ABNORMAL HIGH (ref 0.40–4.50)

## 2022-07-12 MED ORDER — LEVOTHYROXINE SODIUM 125 MCG PO TABS: 125.0000 ug | ORAL_TABLET | Freq: Every day | ORAL | 0 refills | Status: AC

## 2022-07-28 ENCOUNTER — Ambulatory Visit: Payer: BC Managed Care – PPO | Admitting: Obstetrics & Gynecology

## 2022-10-07 ENCOUNTER — Other Ambulatory Visit: Payer: Self-pay | Admitting: Radiology

## 2022-10-09 NOTE — Telephone Encounter (Signed)
Last AEX 07/11/2022--recall placed for 2024.  Per lab result from 07/11/2022--pt will have 12 week f/u thyroid labs done @ [facility]. Will send staff msg to [employer] [employer] and ask if they can inquire from pt if she has had this done.

## 2022-10-09 NOTE — Telephone Encounter (Signed)
Melton Alar, CMA Left message for patient to call and schedule appointment.

## 2022-10-17 NOTE — Telephone Encounter (Signed)
Msg sent to [employer].

## 2022-10-17 NOTE — Telephone Encounter (Signed)
Per staff msg, another msg was left on 10/11/2022-pt has not returned call. Please advise.  Rx pend.

## 2022-10-17 NOTE — Telephone Encounter (Signed)
Please have [employer] call again and send a letter if she does not answer.

## 2022-10-23 ENCOUNTER — Encounter: Payer: Self-pay | Admitting: Radiology

## 2022-11-03 NOTE — Telephone Encounter (Signed)
Letter was mailed to pt on 10/23/2022.

## 2022-11-06 NOTE — Telephone Encounter (Signed)
Deborha Payment, Dondra Prader, CMA Left another message for patient to call me; patient has not called back.

## 2022-11-06 NOTE — Telephone Encounter (Signed)
Courtney Foster, Veneta Penton D, CMA I just spoke with patient she states she has not recheck thyroid levels but she still has medication. She will need a refill in May but she will also have insurance in May and will do follow up then.

## 2024-03-20 ENCOUNTER — Encounter (HOSPITAL_COMMUNITY): Payer: Self-pay

## 2024-03-20 ENCOUNTER — Ambulatory Visit (HOSPITAL_COMMUNITY)
Admission: EM | Admit: 2024-03-20 | Discharge: 2024-03-20 | Disposition: A | Discharge status: Home / Self Care | Payer: Self-pay | Attending: Emergency Medicine | Admitting: Emergency Medicine

## 2024-03-20 DIAGNOSIS — K644 Residual hemorrhoidal skin tags: Secondary | ICD-10-CM

## 2024-03-20 HISTORY — DX: Constipation, unspecified: K59.00

## 2024-03-20 MED ORDER — HYDROCORTISONE 1 % EX CREA: TOPICAL_CREAM | Freq: Four times a day (QID) | CUTANEOUS | 0 refills | Status: AC

## 2024-03-20 MED ORDER — IBUPROFEN 800 MG PO TABS: 800.0000 mg | ORAL_TABLET | Freq: Three times a day (TID) | ORAL | 0 refills | Status: AC

## 2024-03-20 NOTE — ED Provider Notes (Signed)
 [facility]    CSN: [id number] Arrival date & time: 03/20/24  9075      History   Chief Complaint Chief Complaint  Patient presents with   Hemorrhoids    HPI Courtney Foster is a 58 y.o. female.  Patient declines medical interpreter today 1 week history of possible hemorrhoid. Felt an inflammation Rectal pain with bowel movement and sitting No blood  Using OTC hemorrhoid pain relief (lidocaine) Also advil  which helps  She does have straining with BM Has not used any stool softeners.  History of constipation, has followed with GI She used to take miralax daily   Acquired hypothyroidism   Past Medical History:  Diagnosis Date   Arthritis    Constipation    Thyroid  disease    Type 2 diabetes mellitus without complication, without long-term current use of insulin (HCC) 08/11/2017    Patient Active Problem List   Diagnosis Date Noted   Chronic pain of both knees 08/12/2017   Type 2 diabetes mellitus without complication, without long-term current use of insulin (HCC) 08/11/2017   Bilateral ovarian cysts 08/07/2016   Hot flashes due to menopause 07/21/2016   Elevated liver function tests 07/21/2016   Thyroid  activity decreased 08/31/2014   Obesity 08/31/2014   Amenorrhea, secondary 08/31/2014    Past Surgical History:  Procedure Laterality Date   CESAREAN SECTION     X2    OB History     Gravida  4   Para  2   Term      Preterm      AB  2   Living  2      SAB  2   IAB      Ectopic  0   Multiple      Live Births               Home Medications    Prior to Admission medications   Medication Sig Start Date End Date Taking? Authorizing Provider  hydrocortisone  cream 1 % Apply topically 4 (four) times daily. Apply to affected area 2 times daily 03/20/24  Yes Loyce Flaming, Asberry, PA-C  ibuprofen  (ADVIL ) 800 MG tablet Take 1 tablet (800 mg total) by mouth 3 (three) times daily. 03/20/24  Yes Waylen Depaolo, Asberry, PA-C  levothyroxine   (SYNTHROID ) 125 MCG tablet Take 1 tablet (125 mcg total) by mouth daily. Patient not taking: Reported on 03/20/2024 07/12/22   Chrzanowski, Jami B, NP  Multiple Vitamin (MULTIVITAMIN PO) Take by mouth.    [provider]  VITAMIN D  PO Take by mouth. occ    [provider]    Family History History reviewed. No pertinent family history.  Social History Social History   Tobacco Use   Smoking status: Never    Passive exposure: Never   Smokeless tobacco: Never  Vaping Use   Vaping status: Never Used  Substance Use Topics   Alcohol use: No    Alcohol/week: 0.0 standard drinks of alcohol   Drug use: No     Allergies   Patient has no known allergies.   Review of Systems Review of Systems As per HPI  Physical Exam Triage Vital Signs ED Triage Vitals  Encounter Vitals Group     BP 03/20/24 0956 (!) 141/76     Girls Systolic BP Percentile --      Girls Diastolic BP Percentile --      Boys Systolic BP Percentile --      Boys Diastolic BP Percentile --  Pulse Rate 03/20/24 0956 74     Resp 03/20/24 0956 16     Temp 03/20/24 0956 98.1 F (36.7 C)     Temp Source 03/20/24 0956 Oral     SpO2 03/20/24 0956 96 %     Weight --      Height --      Head Circumference --      Peak Flow --      Pain Score 03/20/24 0958 9     Pain Loc --      Pain Education --      Exclude from Growth Chart --    No data found.  Updated Vital Signs BP (!) 141/76 (BP Location: Left Arm)   Pulse 74   Temp 98.1 F (36.7 C) (Oral)   Resp 16   LMP 11/29/2013   SpO2 96%   Physical Exam Vitals and nursing note reviewed. Exam conducted with a chaperone present Shirleyann CMA).  Constitutional:      General: She is not in acute distress.    Appearance: Normal appearance.  HENT:     Mouth/Throat:     Pharynx: Oropharynx is clear.  Cardiovascular:     Rate and Rhythm: Normal rate and regular rhythm.     Pulses: Normal pulses.     Heart sounds: Normal heart sounds.   Pulmonary:     Effort: Pulmonary effort is normal.     Breath sounds: Normal breath sounds.  Abdominal:     Palpations: Abdomen is soft.  Genitourinary:    Rectum: Tenderness and external hemorrhoid present.     Comments: External hemorrhoid, possibly two. One medium sized and one very small. The small may be a prolapsed internal.  Neurological:     Mental Status: She is alert and oriented to person, place, and time.     UC Treatments / Results  Labs (all labs ordered are listed, but only abnormal results are displayed) Labs Reviewed - No data to display  EKG  Radiology No results found.  Procedures Procedures   Medications Ordered in UC Medications - No data to display  Initial Impression / Assessment and Plan / UC Course  I have reviewed the triage vital signs and the nursing notes.  Pertinent labs & imaging results that were available during my care of the patient were reviewed by me and considered in my medical decision making (see chart for details).  External hemorrhoid Consider a small prolapsed internal hemorrhoid as well. However she does not have any bleeding which is reassuring Discussed role constipation plays with hemorrhoid Start back on miralax. Recommend twice daily for several days and then continue daily.  Hydrocortisone  applied 4 times daily.  Continue soaks, ibuprofen , increase fiber and fluids.  I have recommended following up with her GI Return precautions  Final Clinical Impressions(s) / UC Diagnoses   Final diagnoses:  External hemorrhoid     Discharge Instructions      Hydrocortisone  cream applied 4 times daily Advil  every 6 hours as needed for pain Warm soaks in the bath frequently. Can try epsom.   Miralax twice daily for 4 days, then continue once daily Avoid straining, pushing, and sitting for long periods of time Increase fiber and water intake   Please follow up with your gastroenterologist      ED Prescriptions      Medication Sig Dispense Auth. Provider   ibuprofen  (ADVIL ) 800 MG tablet Take 1 tablet (800 mg total) by mouth 3 (three) times daily. 21  tablet Zema Lizardo, Asberry, PA-C   hydrocortisone  cream 1 % Apply topically 4 (four) times daily. Apply to affected area 2 times daily 20 g Hether Anselmo, Asberry, PA-C      PDMP not reviewed this encounter.   Jeryl Asberry, PA-C 03/20/24 1105

## 2024-03-20 NOTE — Discharge Instructions (Signed)
 Hydrocortisone  cream applied 4 times daily Advil  every 6 hours as needed for pain Warm soaks in the bath frequently. Can try epsom.   Miralax twice daily for 4 days, then continue once daily Avoid straining, pushing, and sitting for long periods of time Increase fiber and water intake   Please follow up with your gastroenterologist

## 2024-03-20 NOTE — ED Triage Notes (Signed)
 Patient here today with c/o hemorrhoids since last Friday. She has been using an OTC hemorrhoid cream with no relief. Advil  helps.
